# Patient Record
Sex: Female | Born: 1960 | Smoking: Current every day smoker
Health system: Southern US, Community
[De-identification: ages and names within clinical notes are randomized; demographics above are authoritative.]

## PROBLEM LIST (undated history)

## (undated) DIAGNOSIS — I471 Supraventricular tachycardia, unspecified: Secondary | ICD-10-CM

## (undated) DIAGNOSIS — M797 Fibromyalgia: Secondary | ICD-10-CM

## (undated) DIAGNOSIS — I219 Acute myocardial infarction, unspecified: Secondary | ICD-10-CM

## (undated) DIAGNOSIS — I2510 Atherosclerotic heart disease of native coronary artery without angina pectoris: Secondary | ICD-10-CM

## (undated) DIAGNOSIS — I429 Cardiomyopathy, unspecified: Secondary | ICD-10-CM

## (undated) DIAGNOSIS — F329 Major depressive disorder, single episode, unspecified: Secondary | ICD-10-CM

## (undated) DIAGNOSIS — F1410 Cocaine abuse, uncomplicated: Secondary | ICD-10-CM

## (undated) DIAGNOSIS — F32A Depression, unspecified: Secondary | ICD-10-CM

## (undated) DIAGNOSIS — N390 Urinary tract infection, site not specified: Secondary | ICD-10-CM

## (undated) HISTORY — DX: Cardiomyopathy, unspecified: I42.9

## (undated) HISTORY — DX: Fibromyalgia: M79.7

## (undated) HISTORY — PX: OTHER SURGICAL HISTORY: SHX169

## (undated) HISTORY — DX: Cocaine abuse, uncomplicated: F14.10

## (undated) HISTORY — DX: Major depressive disorder, single episode, unspecified: F32.9

## (undated) HISTORY — DX: Depression, unspecified: F32.A

## (undated) HISTORY — DX: Supraventricular tachycardia, unspecified: I47.10

## (undated) HISTORY — DX: Hypomagnesemia: E83.42

## (undated) HISTORY — DX: Urinary tract infection, site not specified: N39.0

## (undated) HISTORY — DX: Supraventricular tachycardia: I47.1

## (undated) HISTORY — DX: Atherosclerotic heart disease of native coronary artery without angina pectoris: I25.10

## (undated) HISTORY — DX: Acute myocardial infarction, unspecified: I21.9

---

## 2007-10-30 ENCOUNTER — Other Ambulatory Visit: Payer: Self-pay

## 2007-10-30 HISTORY — PX: CARDIAC CATHETERIZATION: SHX172

## 2007-10-30 IMAGING — CR DG CHEST 1V PORT
1 series · 2 of 2 positions shown · non-contrast
Comparison: none

REASON FOR EXAM: mi
COMMENTS:

PROCEDURE:     DXR - DXR PORTABLE CHEST SINGLE VIEW  - October 30, 2007 [DATE]
RESULT:     Comparison: No available comparison exam.

[Series 1: view not recorded · 0.17mm/px · 2 of 2 slices shown]
[im 1/2]
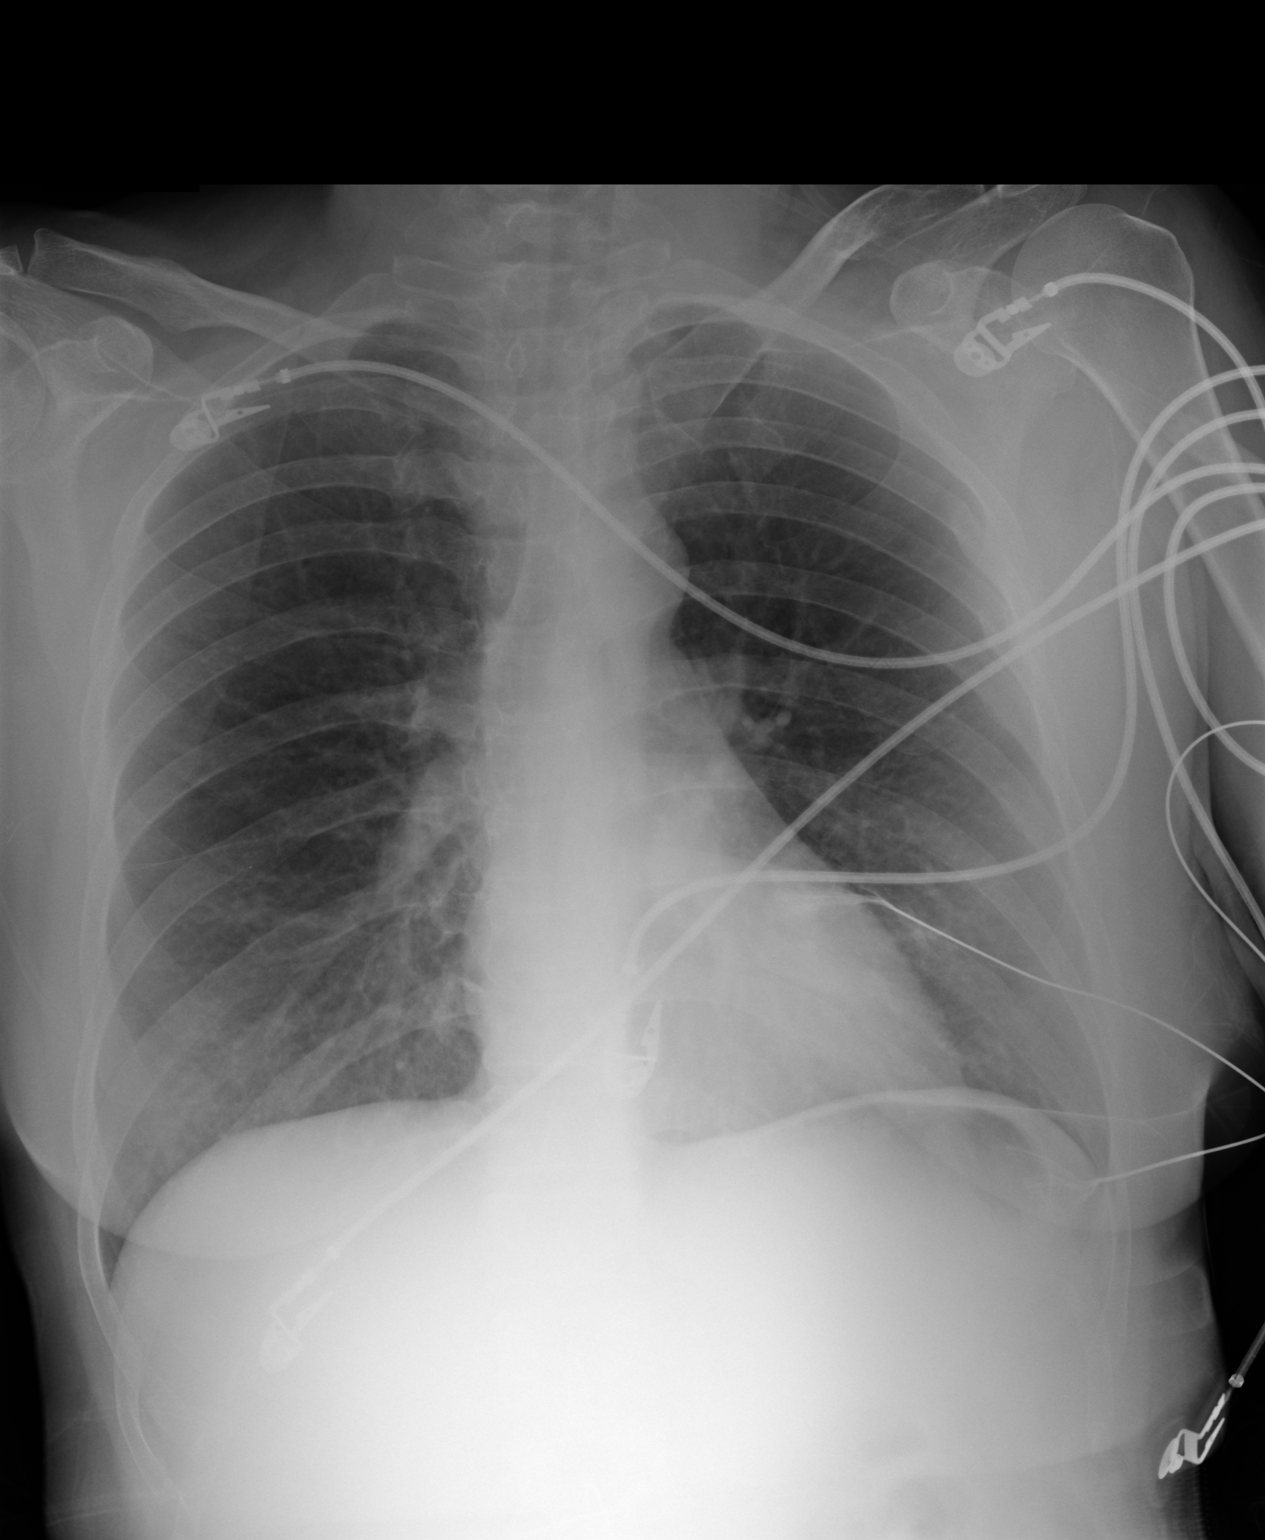
[im 2/2]
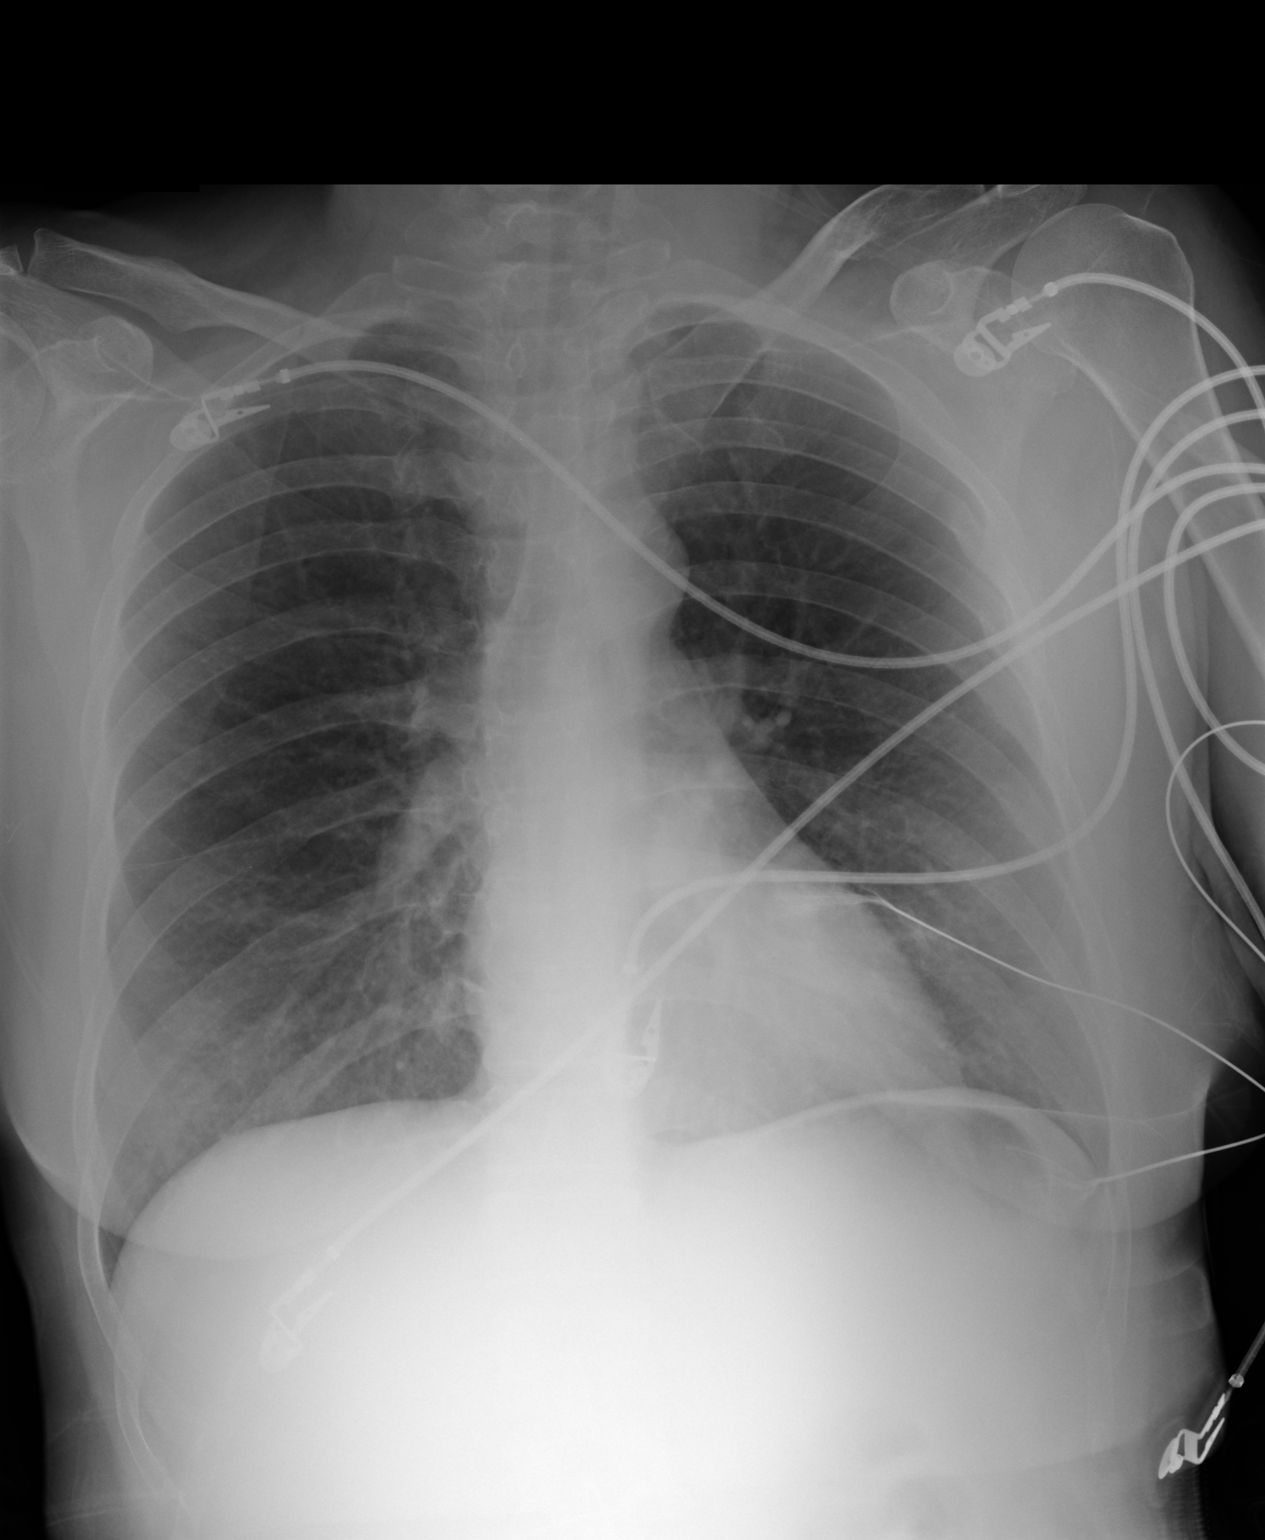

[2 of 2 positions shown; findings below may reference images not displayed]

FINDINGS: There is no significant pulmonary consolidation, pulmonary edema, pleural
effusion, nor pneumothorax. The cardiomediastinal silhouette is within
normal limits.

Old right posterolateral left fourth and fifth rib fracture deformities are
noted.
IMPRESSION: 1. No acute cardiopulmonary abnormality is noted.

## 2007-11-05 ENCOUNTER — Other Ambulatory Visit: Payer: Self-pay

## 2007-11-05 HISTORY — PX: CARDIAC CATHETERIZATION: SHX172

## 2007-11-05 IMAGING — CR DG CHEST 1V PORT
1 series · 1 of 1 positions shown · non-contrast
Comparison: none

REASON FOR EXAM: Chest pain
COMMENTS:

[view not recorded]
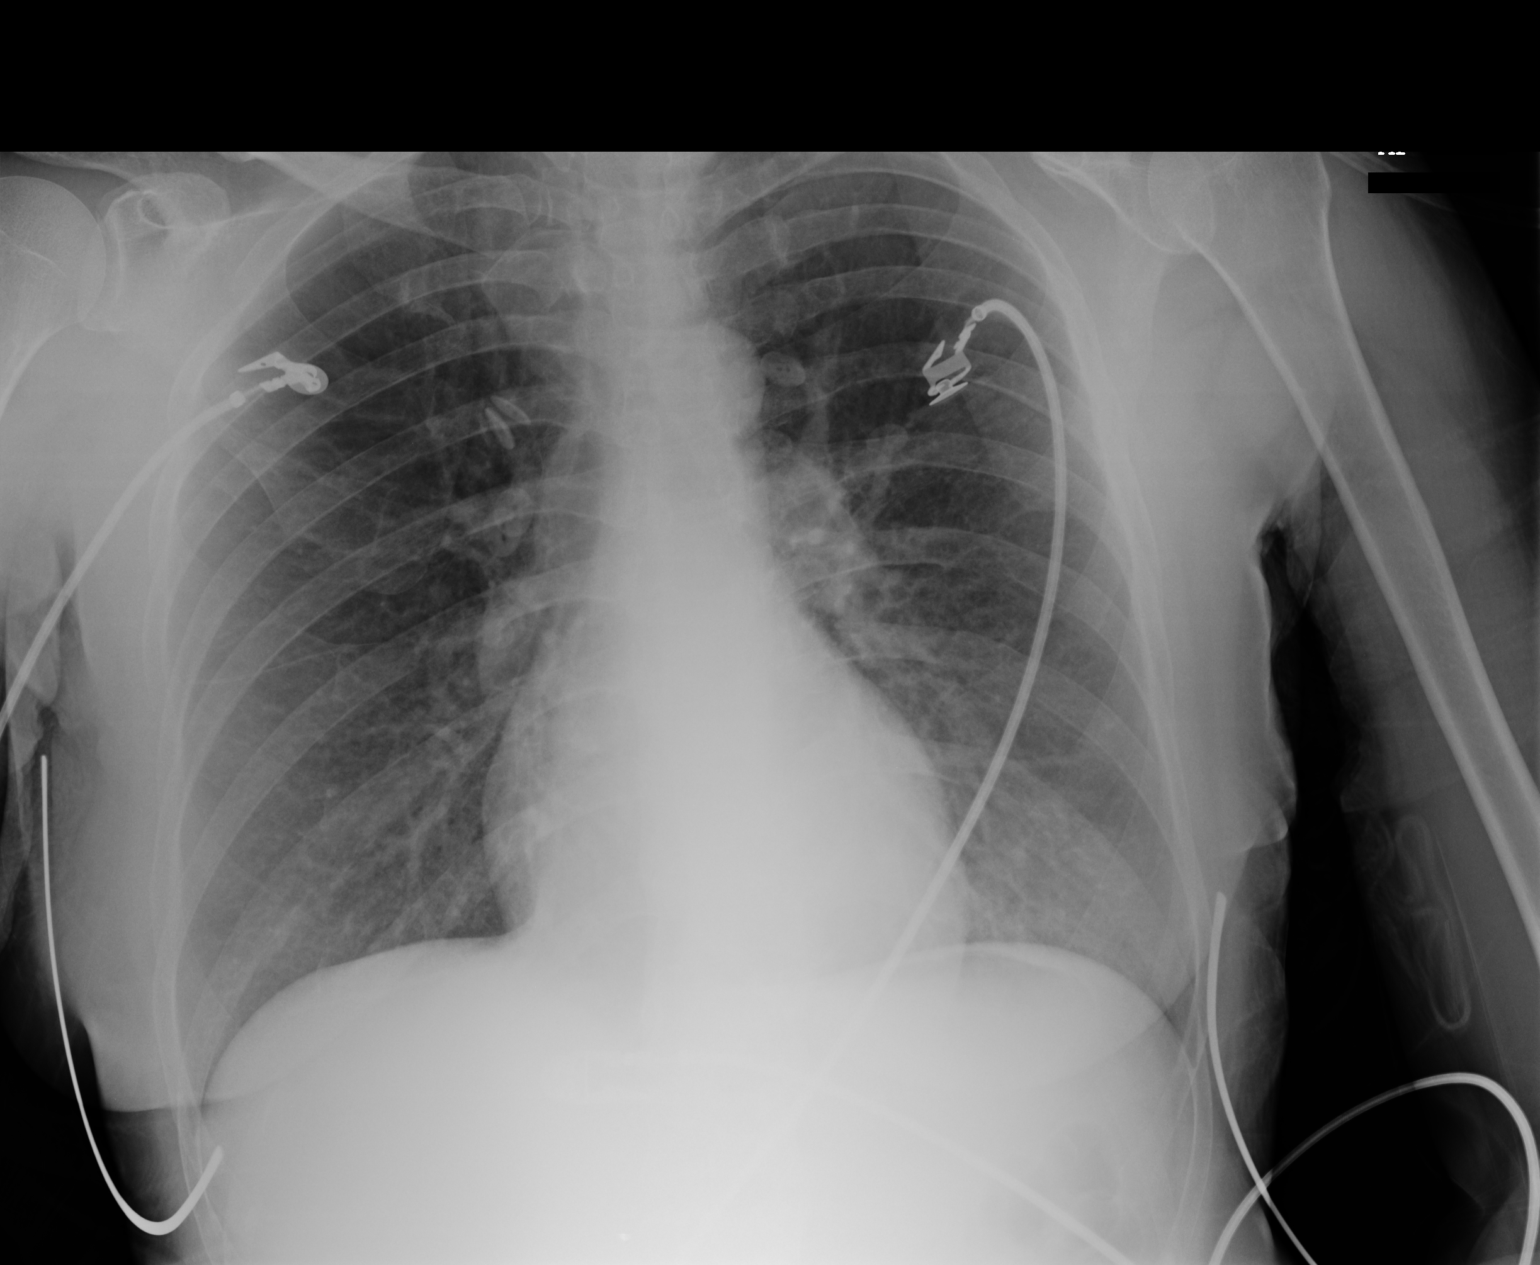

[1 of 1 positions shown; findings below may reference images not displayed]

PROCEDURE:     DXR - DXR PORTABLE CHEST SINGLE VIEW  - November 05, 2007 [DATE]

RESULT:     Comparison is made to a prior exam of 10/30/2007.

There is mild prominence of the pulmonary vascularity compatible with
pulmonary vascular congestion. No pulmonary edema or pleural effusion is
seen. The heart size is normal. Monitoring electrodes are present.
IMPRESSION: 1.  No pneumonia is identified.
2.  There is mild prominence of the pulmonary vascularity compatible with
pulmonary vascular congestion but with no pulmonary edema or pleural
effusion identified at this time.

## 2008-06-06 ENCOUNTER — Other Ambulatory Visit: Payer: Self-pay

## 2008-06-06 ENCOUNTER — Other Ambulatory Visit: Payer: Self-pay | Admitting: Internal Medicine

## 2008-06-06 IMAGING — CR DG CHEST 1V PORT
1 series · 1 of 1 positions shown · non-contrast
Comparison: none

REASON FOR EXAM: chest pain
COMMENTS:

[view not recorded]
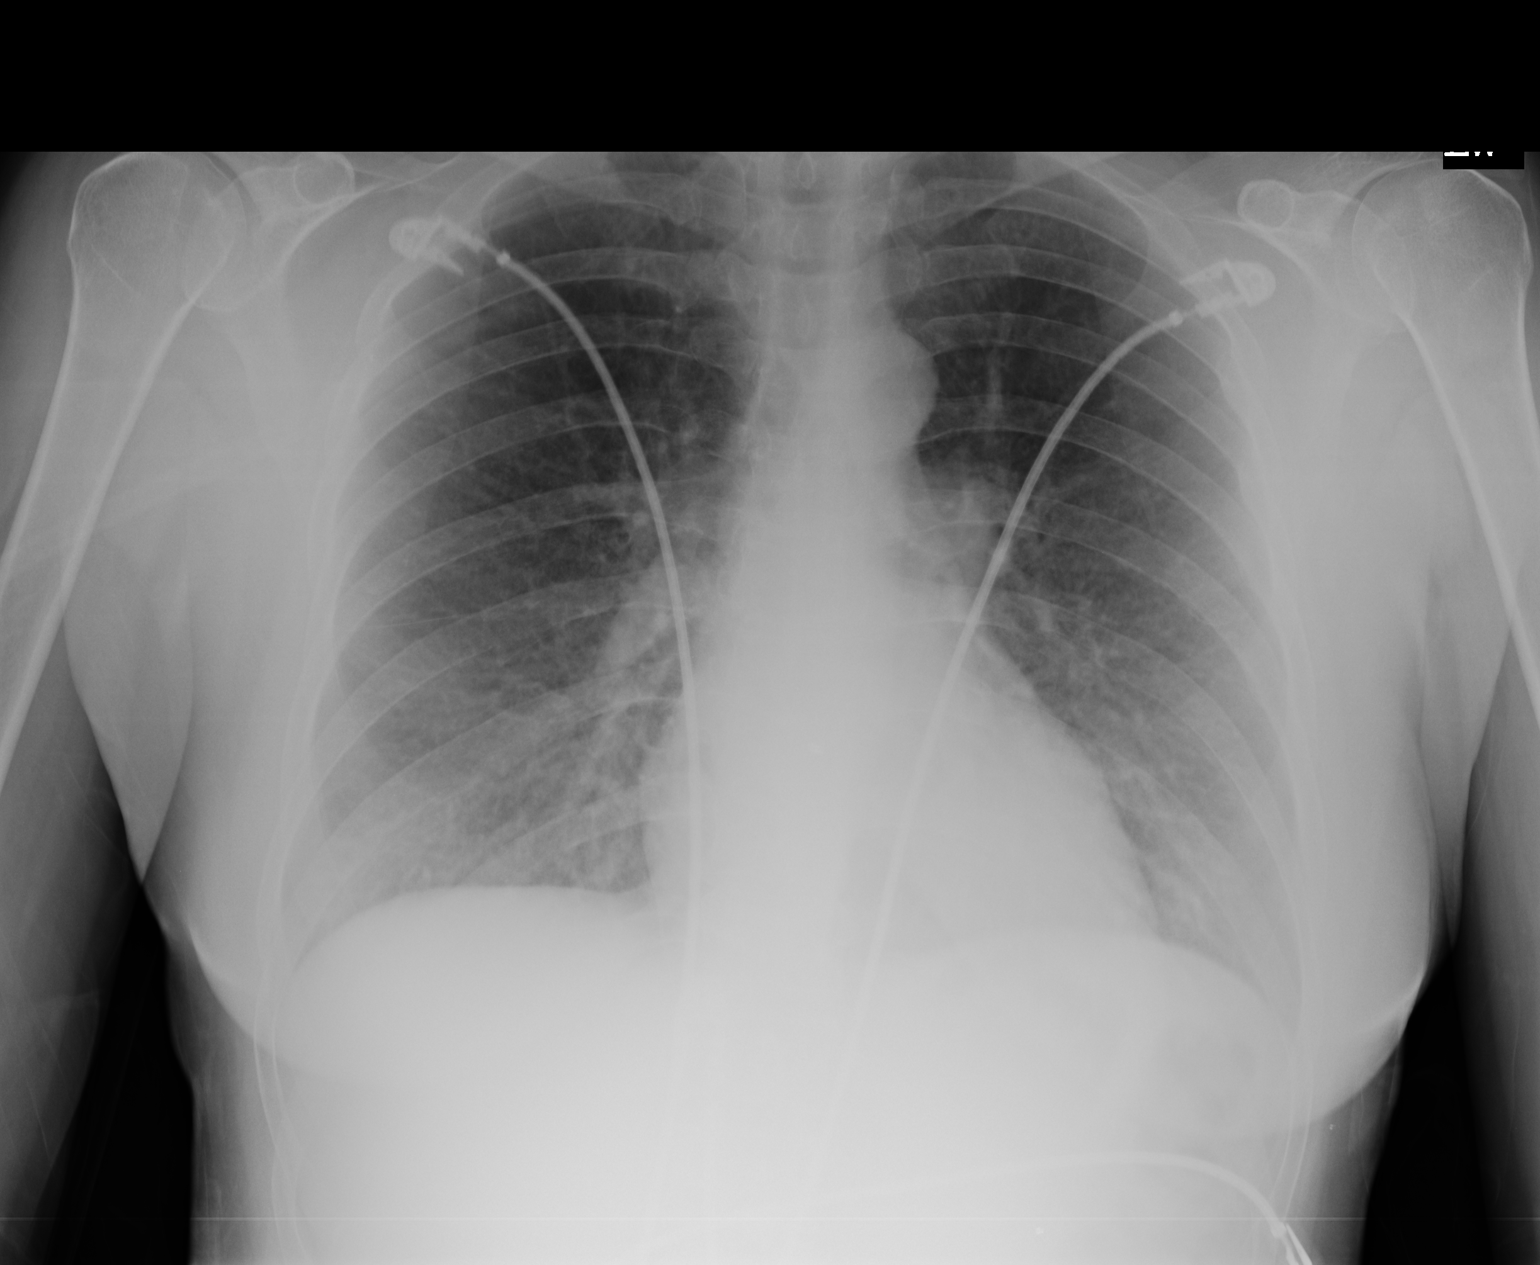

[1 of 1 positions shown; findings below may reference images not displayed]

PROCEDURE:     DXR - DXR PORTABLE CHEST SINGLE VIEW  - June 06, 2008  [DATE]

RESULT:     Comparison is made to a study 05 November, 2007.

The lungs are well expanded. There is no focal infiltrate. The heart is
normal in size. The pulmonary vascularity is mildly prominent centrally.
This is stable. There is no pleural effusion.
IMPRESSION: I do not see objective evidence of CHF nor pneumonia. The lungs are
well-expanded which may be voluntary or which may reflect an element of
underlying COPD or reactive airway disease. Overall the chest has not
changed significantly in appearance since 05 November, 2007.

## 2008-11-11 HISTORY — PX: OTHER SURGICAL HISTORY: SHX169

## 2008-11-28 ENCOUNTER — Other Ambulatory Visit: Payer: Self-pay

## 2008-11-28 IMAGING — CR DG CHEST 1V PORT
1 series · 1 of 1 positions shown · non-contrast
Comparison: none

REASON FOR EXAM: Chest Pain
COMMENTS:

[view not recorded]
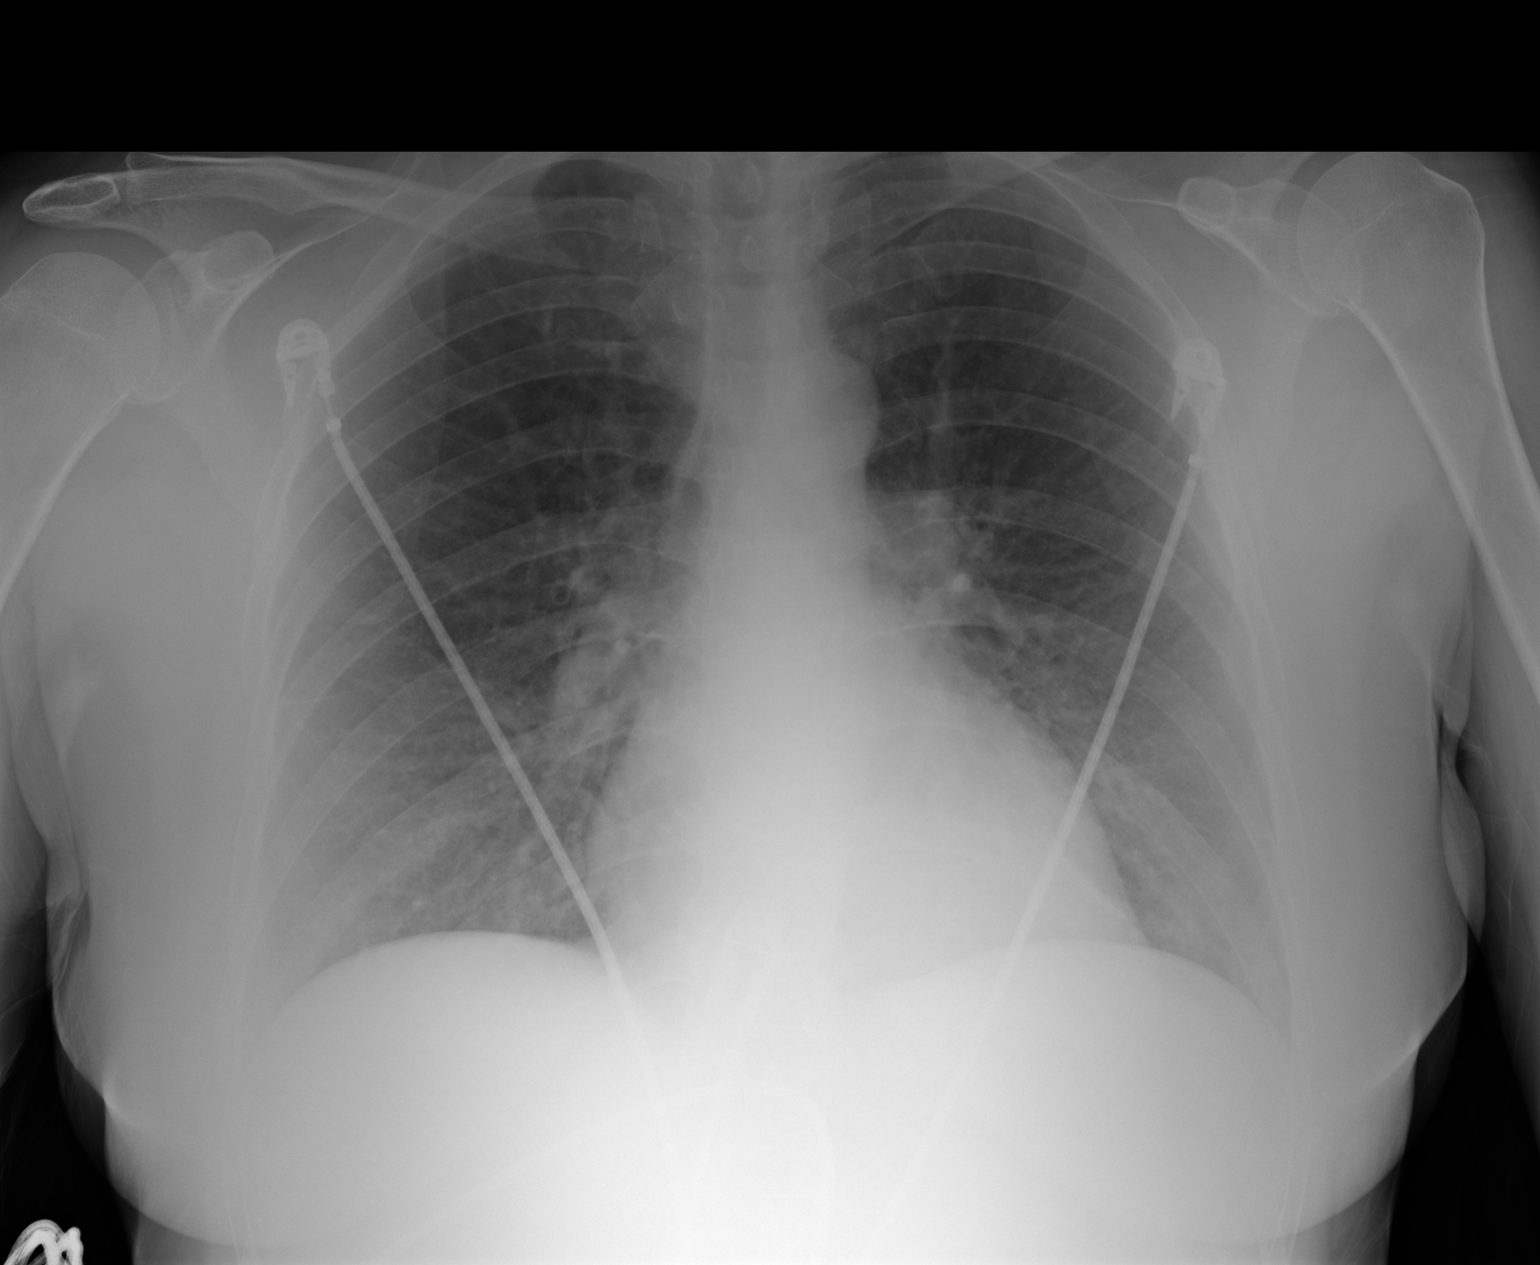

[1 of 1 positions shown; findings below may reference images not displayed]

PROCEDURE:     DXR - DXR PORTABLE CHEST SINGLE VIEW  - November 28, 2008  [DATE]

RESULT:     Comparison is made to the study of 06/06/2008.

Cardiac monitoring electrodes are present. The lungs are clear. The heart
and pulmonary vessels are normal. The bony and mediastinal structures are
unremarkable. There is no effusion. There is no pneumothorax or evidence of
congestive failure.
IMPRESSION: No acute cardiopulmonary disease.

## 2009-09-27 ENCOUNTER — Other Ambulatory Visit: Payer: Self-pay | Admitting: Emergency Medicine

## 2009-09-28 IMAGING — CR DG CHEST 1V PORT
1 series · 1 of 1 positions shown · non-contrast
Comparison: none

REASON FOR EXAM: chest pain
COMMENTS:

PROCEDURE:     DXR - DXR PORTABLE CHEST SINGLE VIEW  - September 28, 2009 [DATE]
RESULT:     Comparison is made to the prior exam of 11/28/2008. The lung
fields are clear. No pneumonia, pneumothorax or pleural effusion is seen.
Heart size is normal. Monitoring electrodes are present.

[view not recorded]
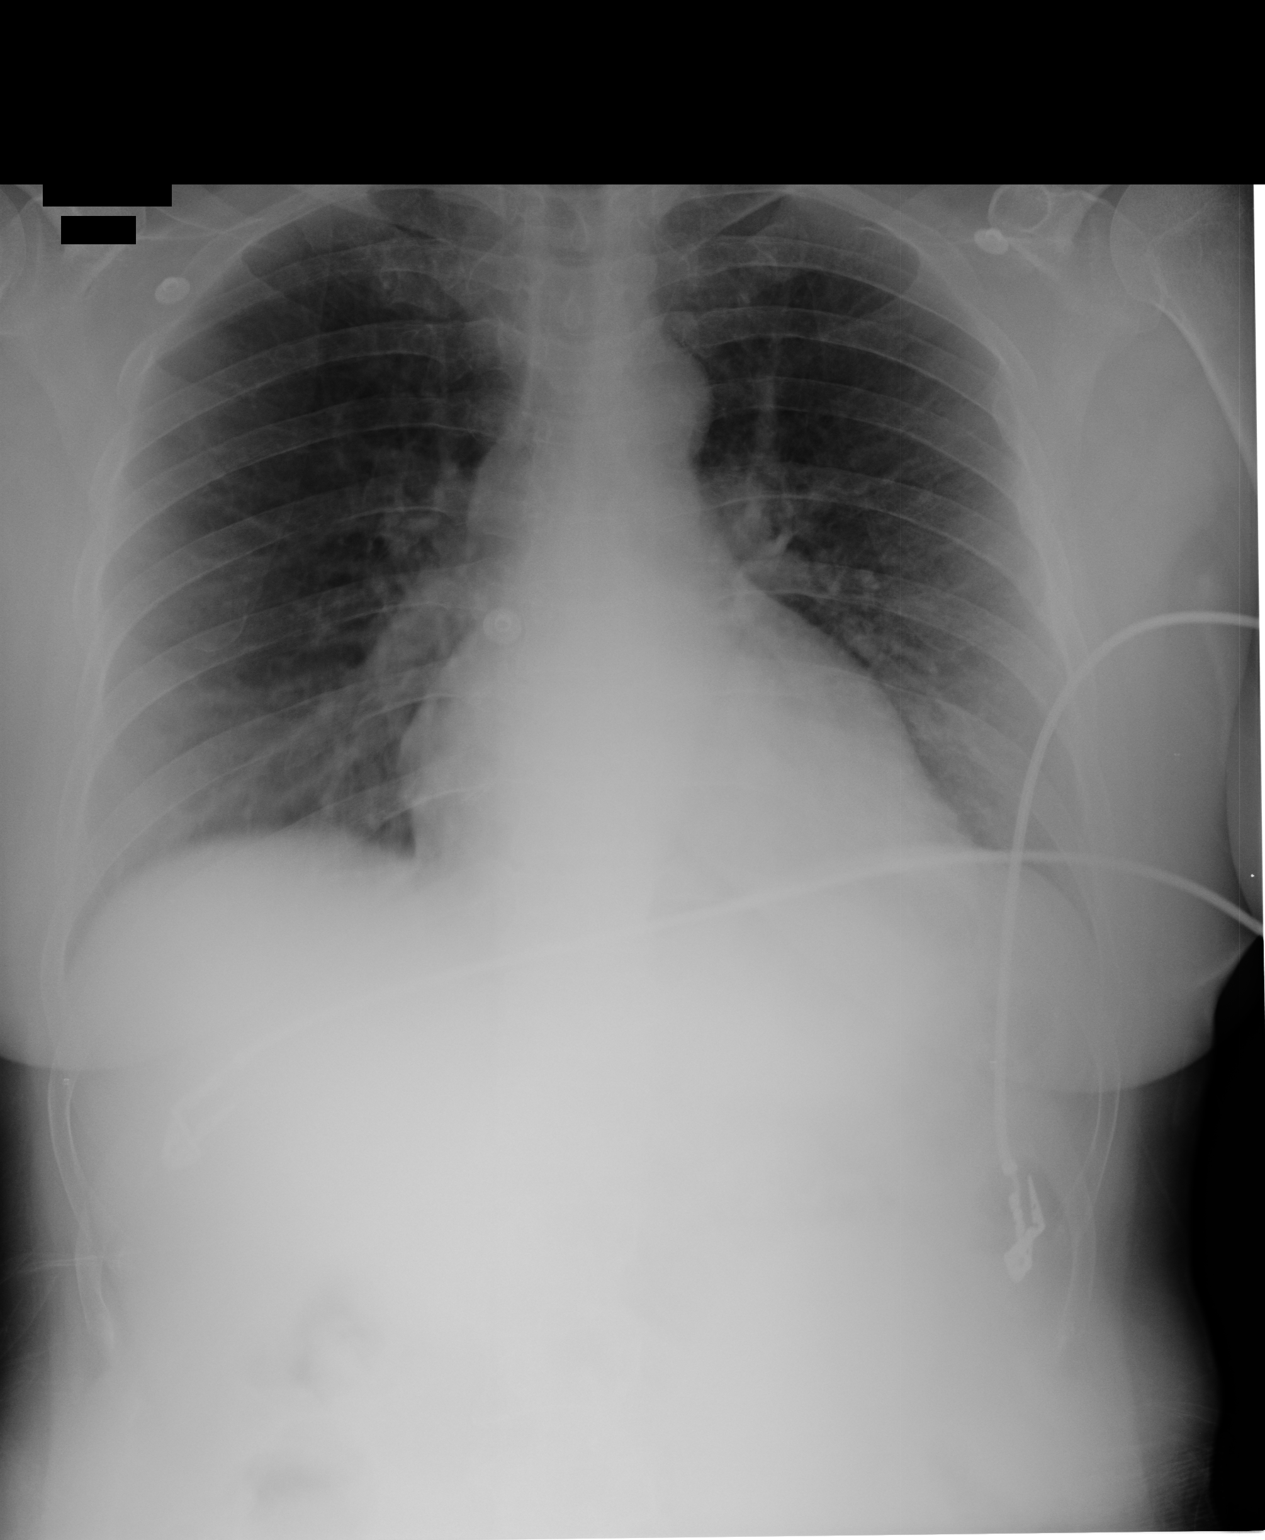

[1 of 1 positions shown; findings below may reference images not displayed]

IMPRESSION: No acute changes are identified.

## 2009-09-28 IMAGING — CT CT HEAD WITHOUT CONTRAST
2 series · 15 of 30 positions shown, 19 images · non-contrast
Comparison: none

REASON FOR EXAM: headache fever
COMMENTS:

[Series 2: without · axial · non-contrast · 0.40mm/px · z∈[+226,+350]mm · 13 of 31 slices shown, 17 images]
[im 3/31  brain]
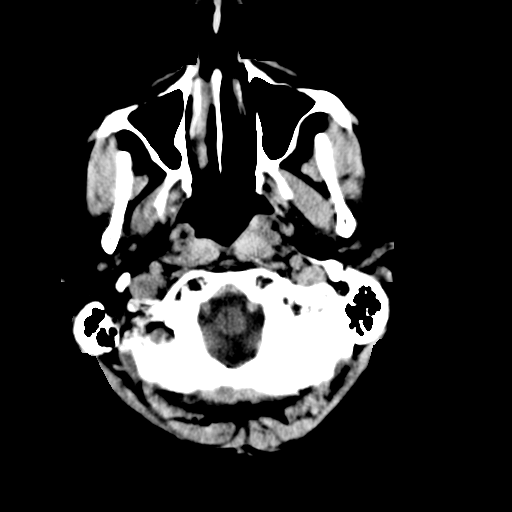
[im 3/31  bone]
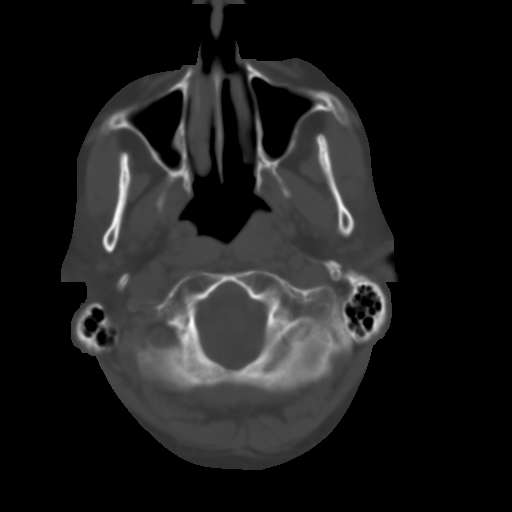
[im 5/31  brain]
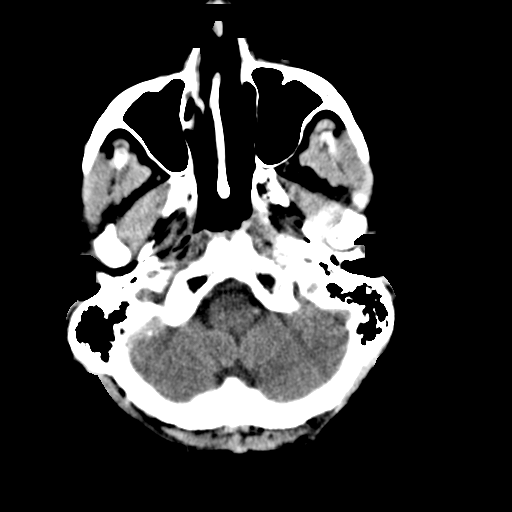
[im 7/31  brain]
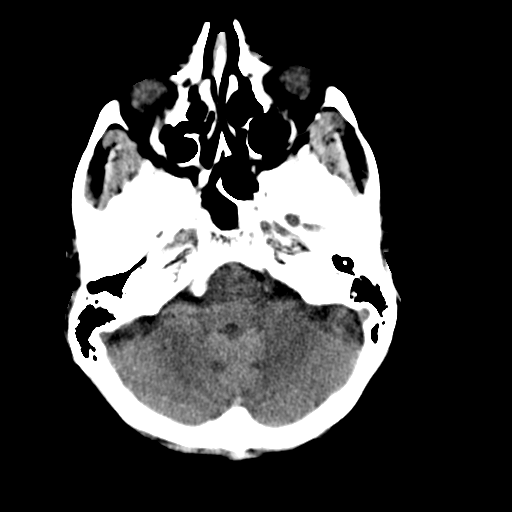
[im 9/31  brain]
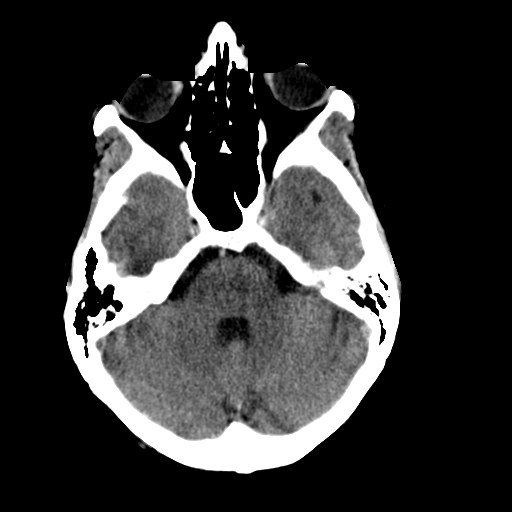
[im 11/31  brain]
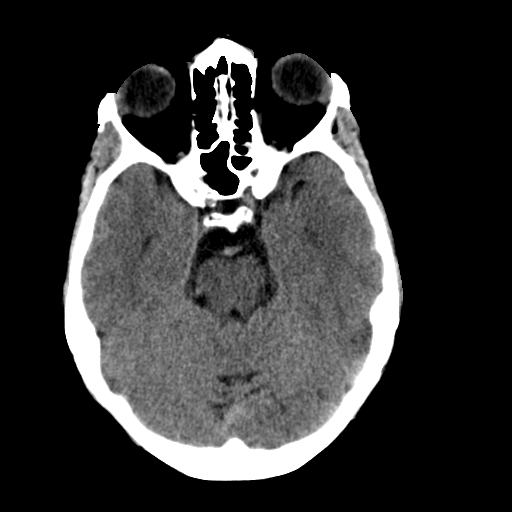
[im 11/31  bone]
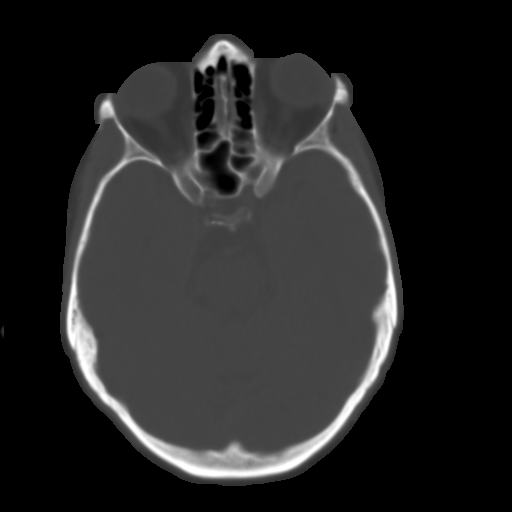
[im 13/31  brain]
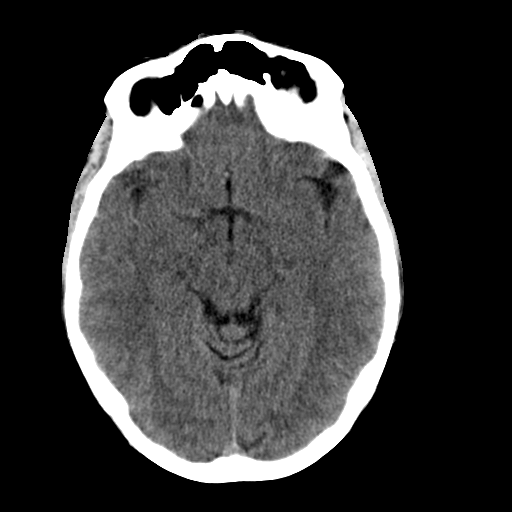
[im 16/31  brain]
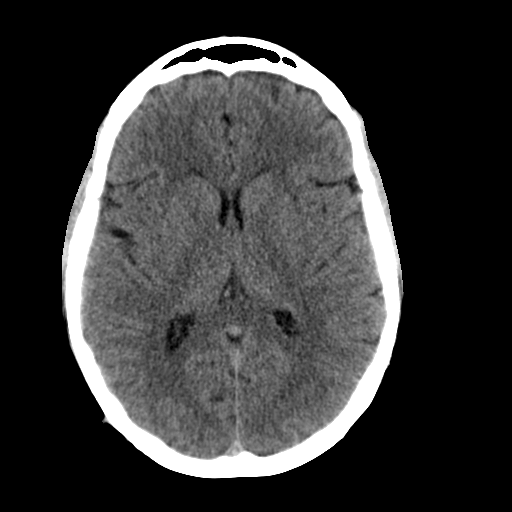
[im 18/31  brain]
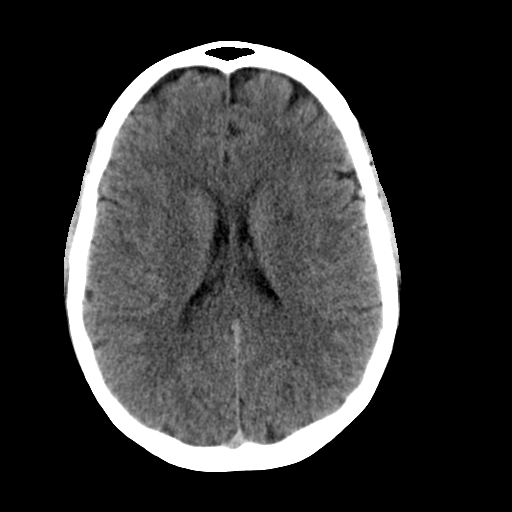
[im 20/31  brain]
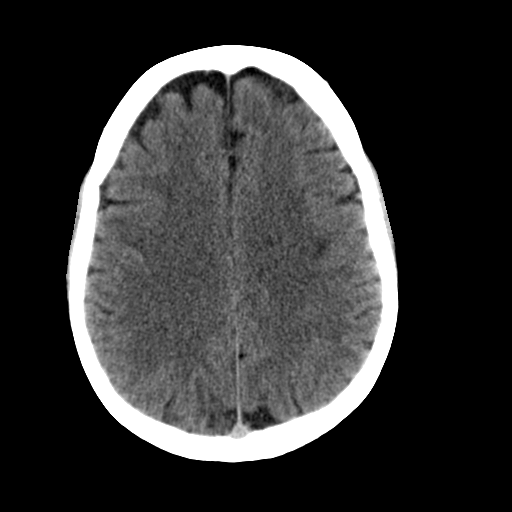
[im 20/31  bone]
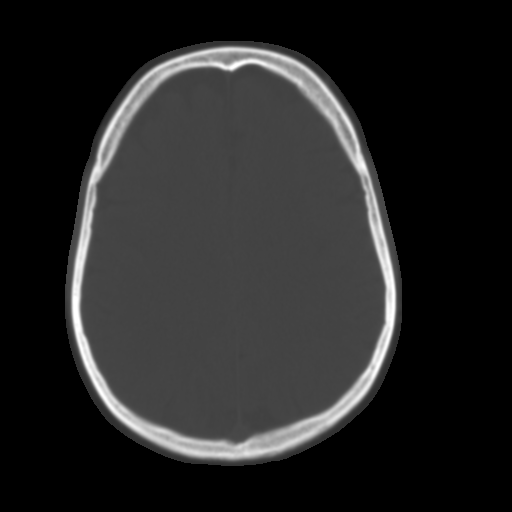
[im 22/31  brain]
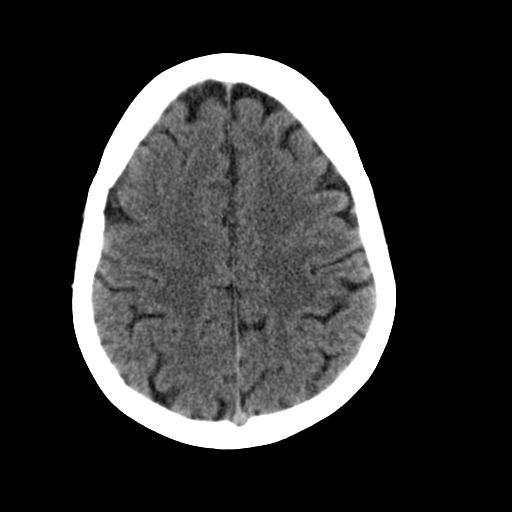
[im 24/31  brain]
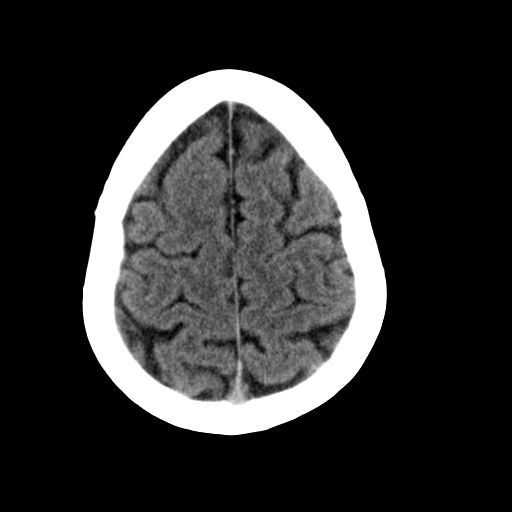
[im 26/31  brain]
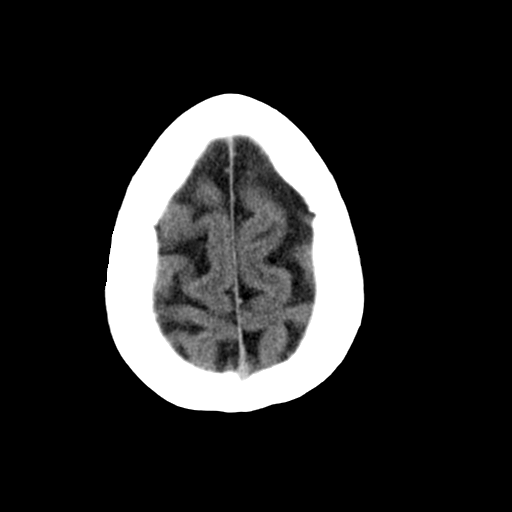
[im 28/31  brain]
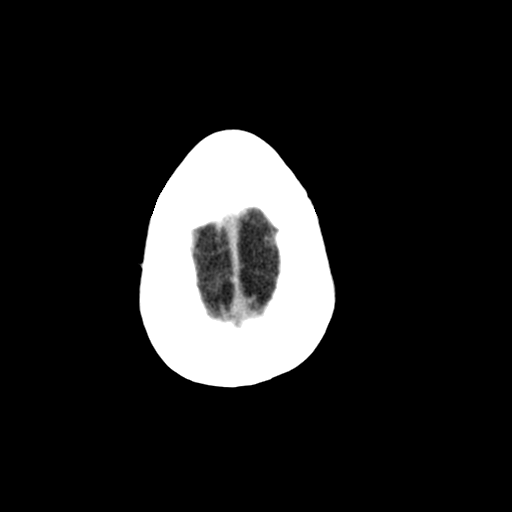
[im 28/31  bone]
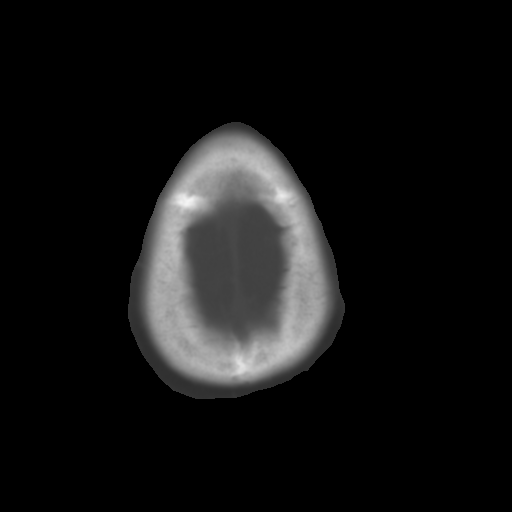

[Series 3: bone · axial · 0.40mm/px · z∈[+226,+246]mm · 2 of 31 slices shown]
[im 3/31  bone]
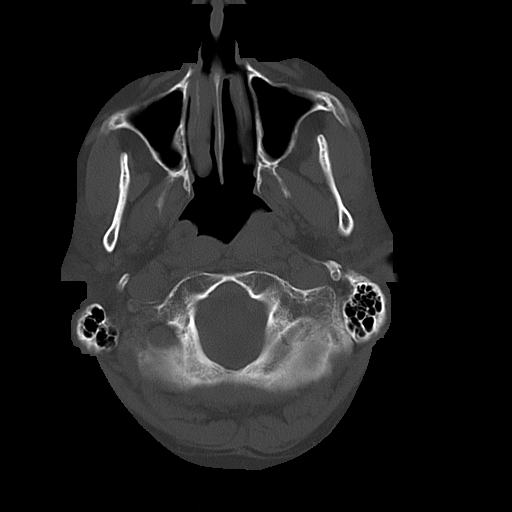
[im 7/31  bone]
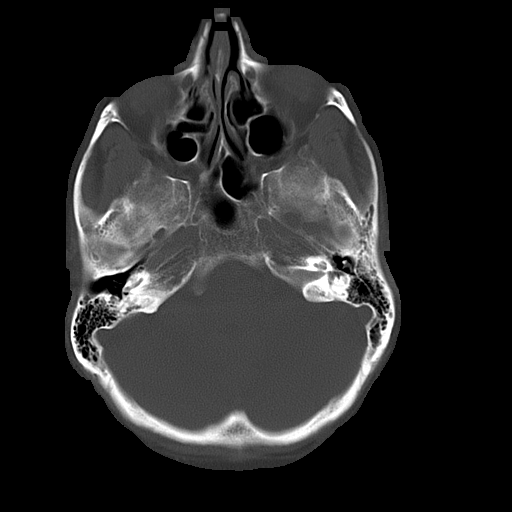

[15 of 30 positions shown; findings below may reference images not displayed]

PROCEDURE:     CT  - CT HEAD WITHOUT CONTRAST  - [DATE]  [DATE]

RESULT:     Axial noncontrast CT scanning was performed through the brain at
5 mm intervals and slice thicknesses.

The ventricles are normal in size and position. There is no intracranial
hemorrhage nor intracranial mass effect. There is decreased density in the
deep white matter of the anterior parietal lobe on the left and to a lesser
extent on the right which likely reflects the sequelae of chronic small
vessel ischemic type change. At bone window settings there is no evidence of
acute paranasal sinus inflammation nor acute skull fracture.
IMPRESSION: 1. I do not see evidence of an acute intracranial hemorrhage.
2. Deep white matter hypodensity is present which likely reflects the
sequelae of chronic small vessel ischemic type change but other etiologies
are not excluded.
3. I do not see evidence of acute paranasal sinus inflammation.

It may be useful to consider the patient for followup MRI of the brain if
the symptoms persist.

A preliminary report was sent to the [HOSPITAL] the conclusion
of the study.

## 2012-11-15 ENCOUNTER — Other Ambulatory Visit: Payer: Self-pay

## 2012-11-15 ENCOUNTER — Other Ambulatory Visit: Payer: Self-pay | Admitting: Emergency Medicine

## 2012-11-15 IMAGING — CT CT CHEST W/ CM
1 series · 16 of 33 positions shown, 20 images · IV contrast (APPLIED)
Comparison: none

REASON FOR EXAM: tachycardia w/ cp and elev d-dimer
COMMENTS:

PROCEDURE:     CT  - CT CHEST (FOR PE) W  - November 15, 2012  [DATE]
RESULT:     History: Tachycardia.
Comparison Study: Chest x-ray of 11/15/2012.

[Series 4: soft tissue · axial · 0.67mm/px · z∈[-444,-132]mm · 16 of 114 slices shown, 20 images]
[im 5/114  mediastinal]
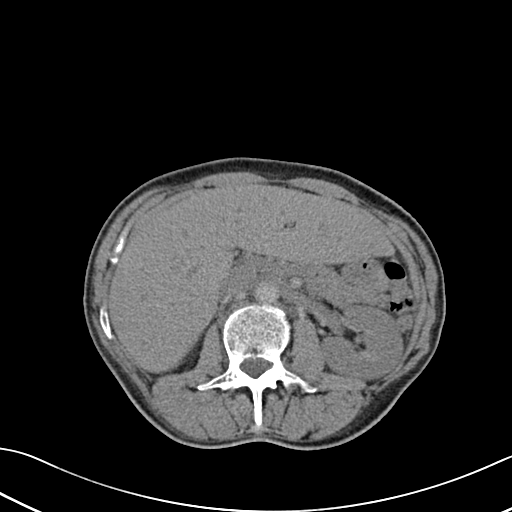
[im 5/114  lung]
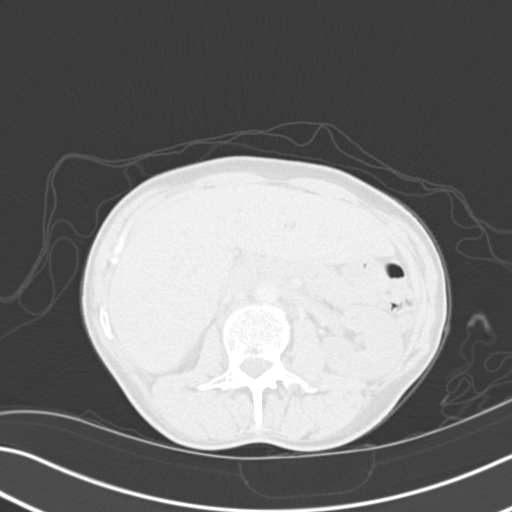
[im 13/114  lung]
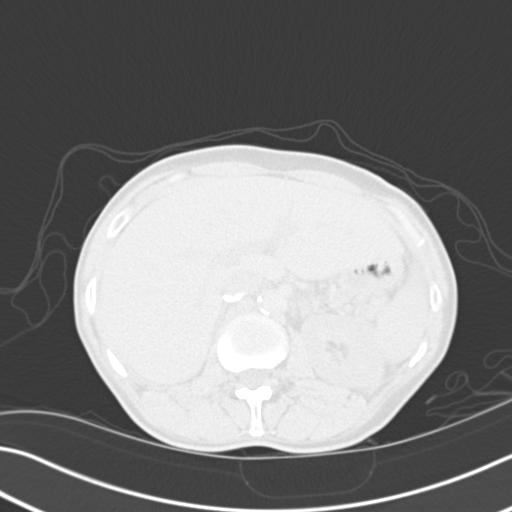
[im 21/114  lung]
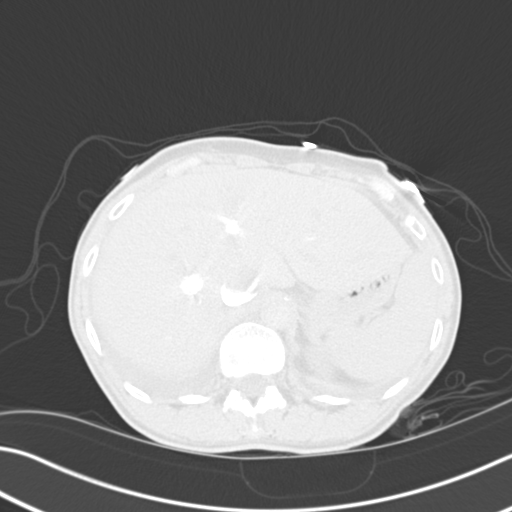
[im 26/114  lung]
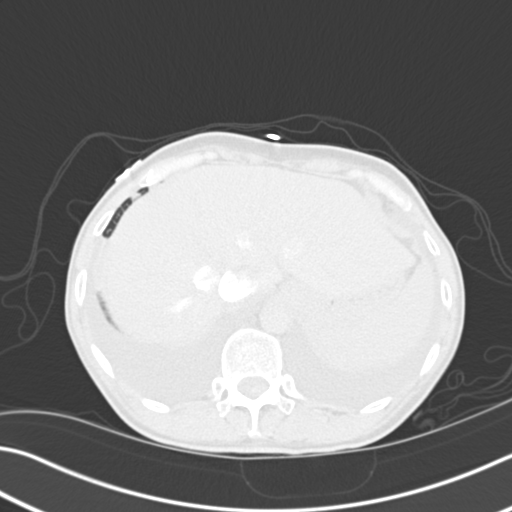
[im 34/114  mediastinal]
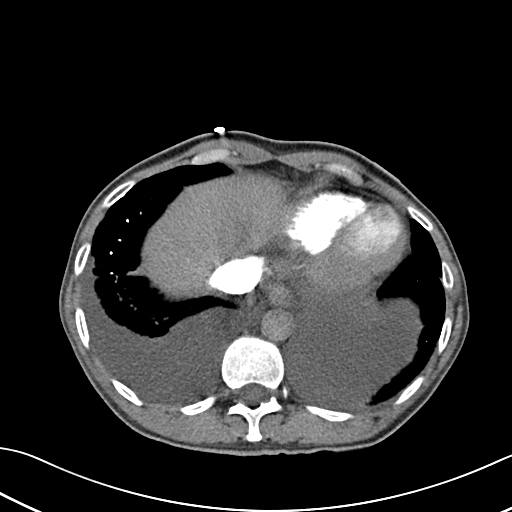
[im 34/114  lung]
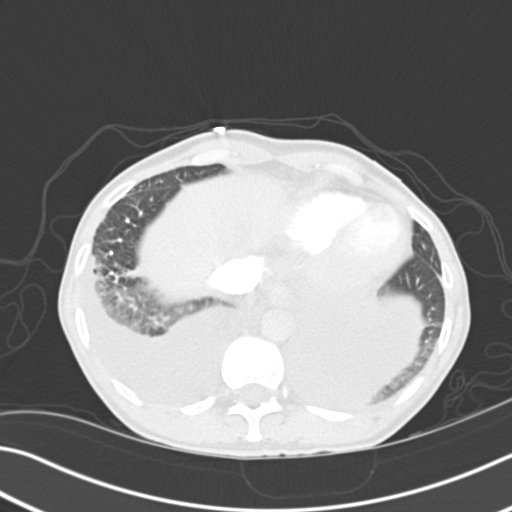
[im 42/114  lung]
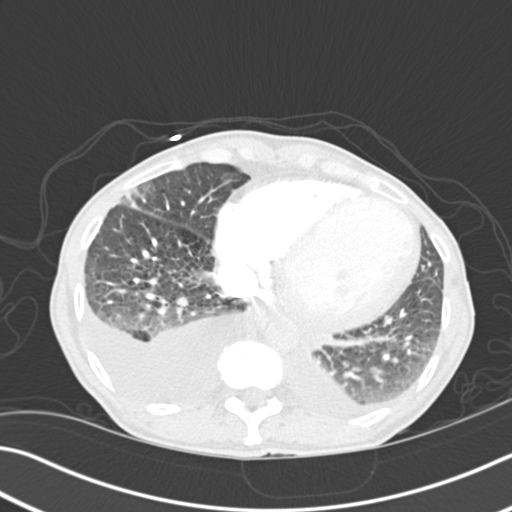
[im 47/114  lung]
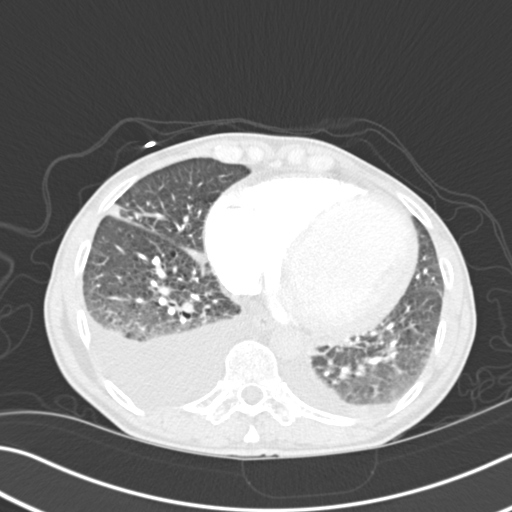
[im 55/114  lung]
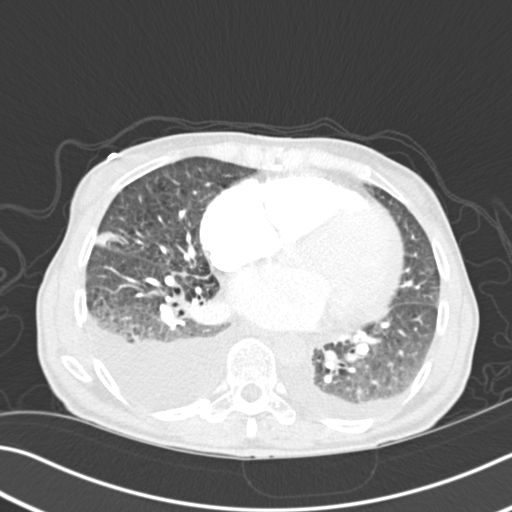
[im 60/114  mediastinal]
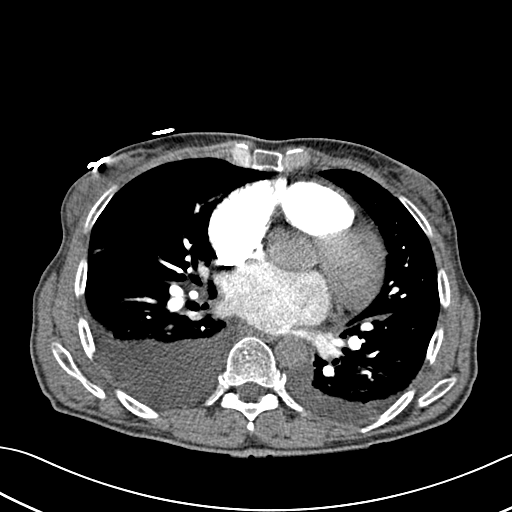
[im 60/114  lung]
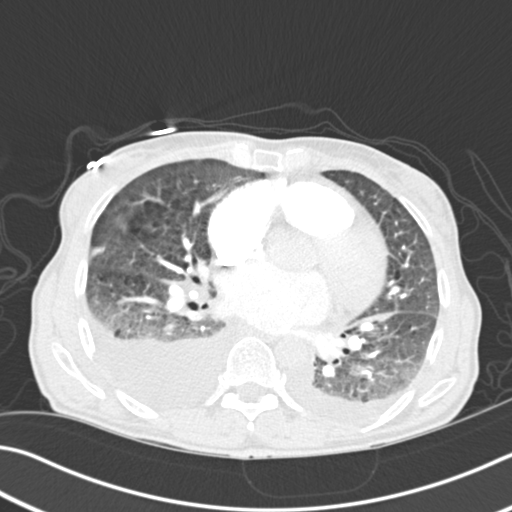
[im 67/114  lung]
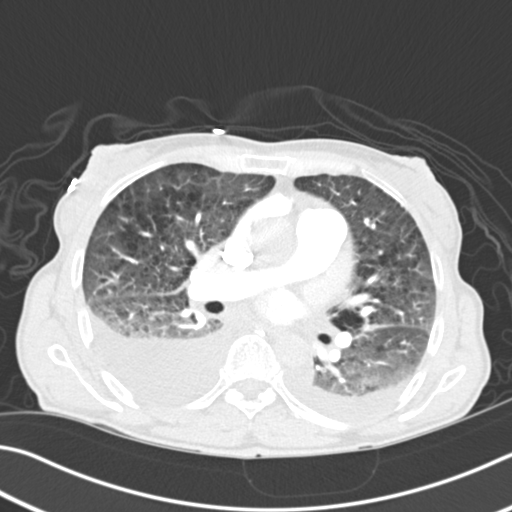
[im 72/114  lung]
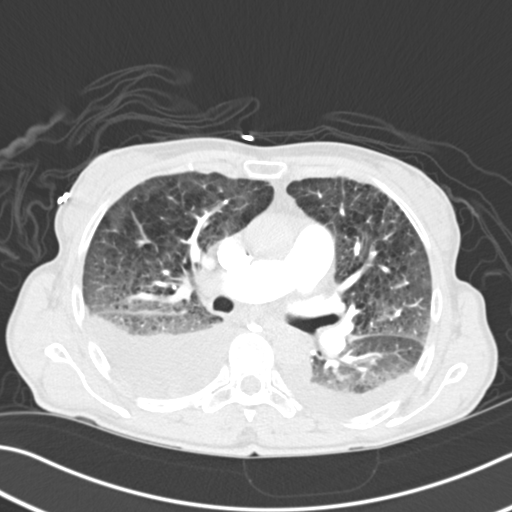
[im 80/114  lung]
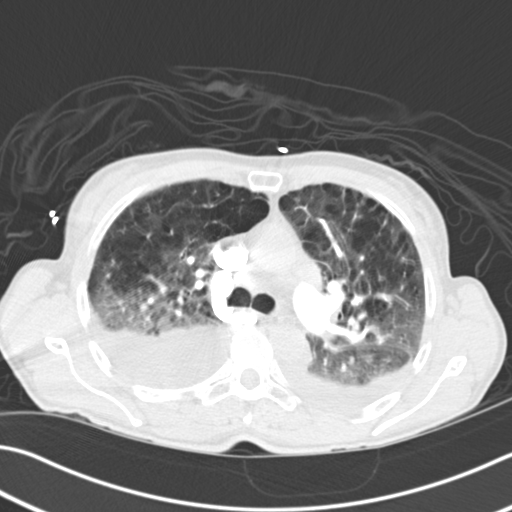
[im 88/114  mediastinal]
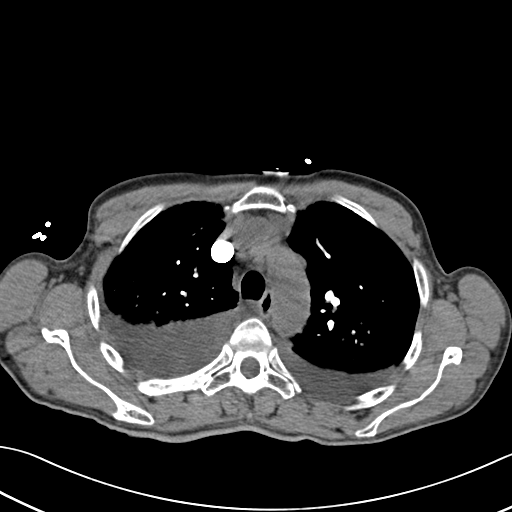
[im 88/114  lung]
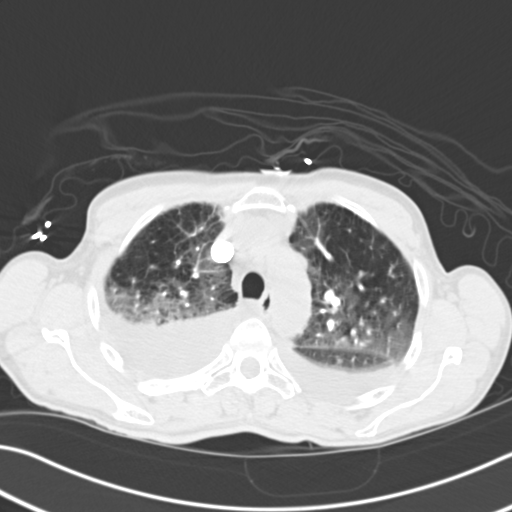
[im 93/114  lung]
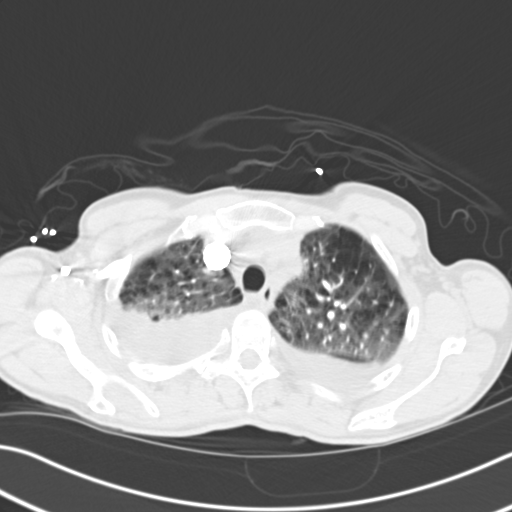
[im 101/114  lung]
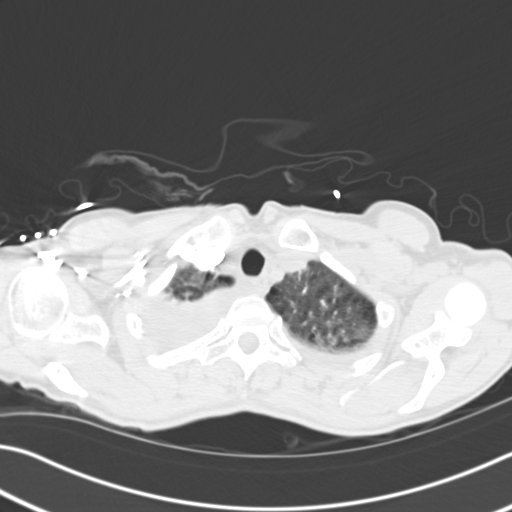
[im 109/114  lung]
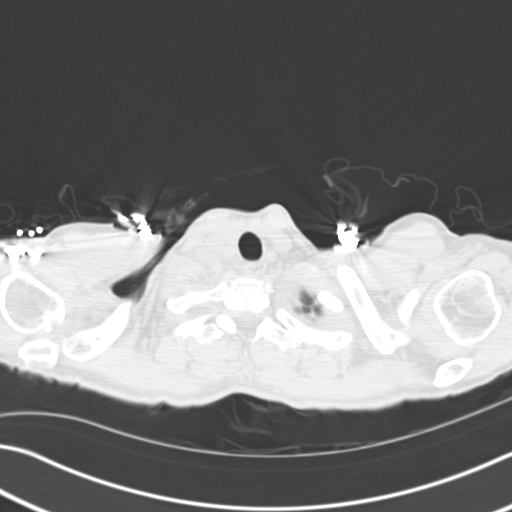

[16 of 33 positions shown; findings below may reference images not displayed]

FINDINGS: Standard CT performed with 100 cc of [id number]. Thoracic aorta
normal caliber. Coronary artery disease. Cardiomegaly with diffuse bilateral
pulmonary edema and bilateral pleural effusions consistent with congestive
heart failure. No pulmonary embolus. Adrenals unremarkable.
IMPRESSION: CHF with prominent bilateral pulmonary edema and bilateral
pleural effusions.

## 2012-11-15 IMAGING — CR DG CHEST 1V PORT
1 series · 1 of 1 positions shown · non-contrast
Comparison: none

REASON FOR EXAM: Chest Pain
COMMENTS:

PROCEDURE:     DXR - DXR PORTABLE CHEST SINGLE VIEW  - November 15, 2012  [DATE]
RESULT:     Comparison: 09/28/2009

[ap]
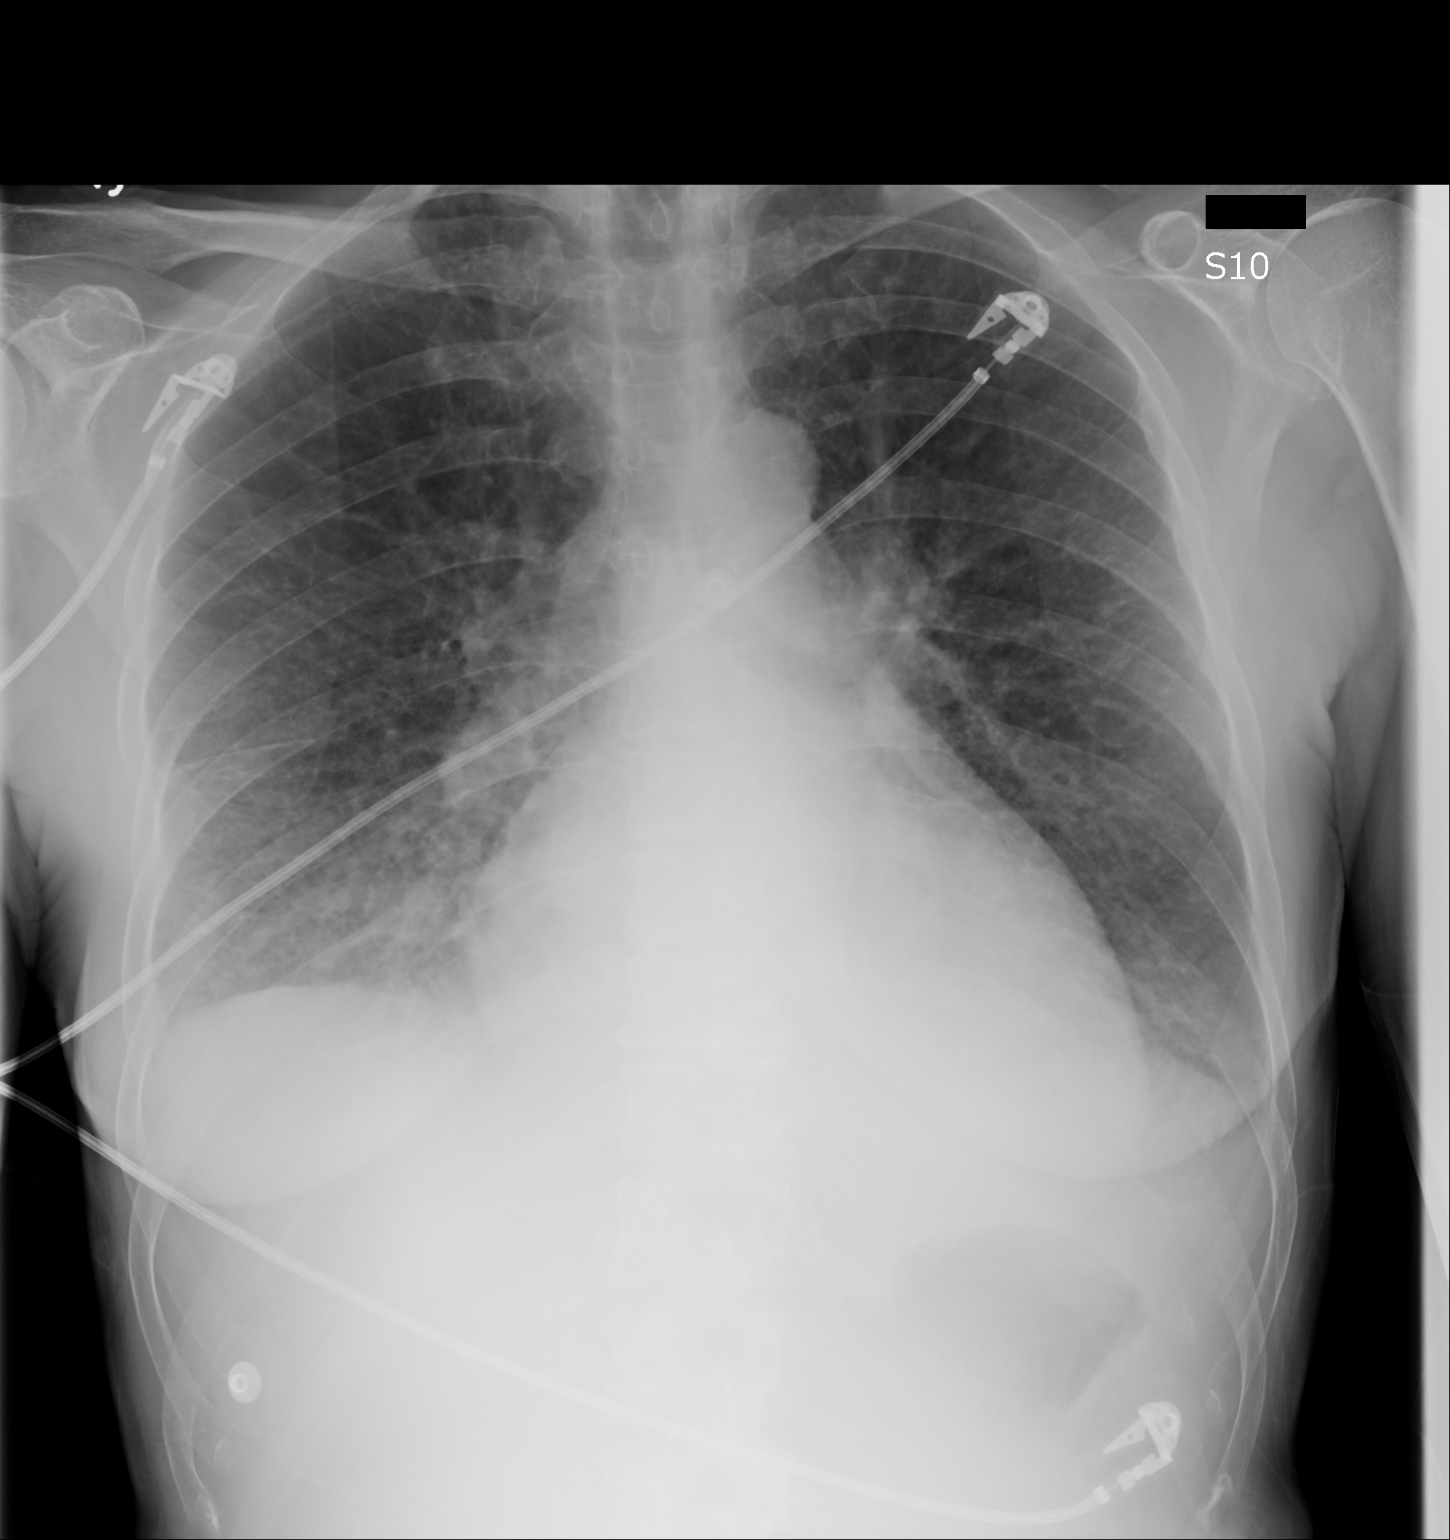

[1 of 1 positions shown; findings below may reference images not displayed]

FINDINGS: Heart size is upper limits normal to mildly enlarged. This is similar to
slightly increased from prior. Mild hilar prominence is likely related to
prominent central to pulmonary vasculature. There are bilateral interstitial
opacities.
IMPRESSION: 1. Nonspecific interstitial opacities could represent interstitial edema,
atypical infection, or interstitial lung disease.
2. Mild hilar prominence is likely related to prominent central pulmonary
vasculature. Lymphadenopathy would be difficult to exclude. Followup PA and
lateral chest radiograph is recommended.

[REDACTED]

## 2012-11-16 DIAGNOSIS — R0602 Shortness of breath: Secondary | ICD-10-CM

## 2012-11-16 DIAGNOSIS — R079 Chest pain, unspecified: Secondary | ICD-10-CM

## 2012-11-17 ENCOUNTER — Telehealth: Payer: Self-pay

## 2012-11-17 DIAGNOSIS — I471 Supraventricular tachycardia, unspecified: Secondary | ICD-10-CM

## 2012-11-17 NOTE — Telephone Encounter (Signed)
Pt d/c today 5/7 Will attempt TCM #1 5/8 

## 2012-11-17 NOTE — Telephone Encounter (Signed)
Message copied by Marcelle Overlie on Wed Nov 17, 2012  3:16 PM ------      Message from: Coralee Rud      Created: Wed Nov 17, 2012  2:59 PM      Regarding: tcm/ph       Tcm/ph 11/25/12 ------

## 2012-11-17 NOTE — Telephone Encounter (Signed)
tcm

## 2012-11-18 ENCOUNTER — Encounter: Payer: Self-pay | Admitting: Cardiovascular Disease

## 2012-11-18 NOTE — Telephone Encounter (Signed)
TCM call #1- I left a message at the patient's only listed contact # to call.

## 2012-11-19 NOTE — Telephone Encounter (Signed)
Patient contacted regarding discharge from [facility] on 11/17/12.  Patient understands to follow up with provider Alinda Money, PA on 11/25/12 at 2:30 pm at [facility] office. Patient understands discharge instructions? yes Patient understands medications and regiment? yes Patient understands to bring all medications to this visit? yes  Pt says she is on her way to work this am. She works at [employer]. I explained, b/c of her recent d/c we could give her a note. She declines, says she will "just answer phones today". She says she still has occasional CP but attributes this to stress. No further tachycardia spells. Confirms compliance with meds as prescribed.

## 2012-11-24 ENCOUNTER — Encounter: Payer: Self-pay | Admitting: *Deleted

## 2012-11-25 ENCOUNTER — Encounter: Payer: Self-pay | Admitting: Physician Assistant

## 2012-11-25 ENCOUNTER — Ambulatory Visit (INDEPENDENT_AMBULATORY_CARE_PROVIDER_SITE_OTHER): Payer: 59 | Admitting: Physician Assistant

## 2012-11-25 VITALS — BP 102/62 | HR 81 | Ht 65.0 in | Wt 109.8 lb

## 2012-11-25 DIAGNOSIS — I2510 Atherosclerotic heart disease of native coronary artery without angina pectoris: Secondary | ICD-10-CM

## 2012-11-25 DIAGNOSIS — I429 Cardiomyopathy, unspecified: Secondary | ICD-10-CM | POA: Insufficient documentation

## 2012-11-25 DIAGNOSIS — R000 Tachycardia, unspecified: Secondary | ICD-10-CM

## 2012-11-25 DIAGNOSIS — I471 Supraventricular tachycardia, unspecified: Secondary | ICD-10-CM

## 2012-11-25 DIAGNOSIS — Z720 Tobacco use: Secondary | ICD-10-CM | POA: Insufficient documentation

## 2012-11-25 DIAGNOSIS — R079 Chest pain, unspecified: Secondary | ICD-10-CM

## 2012-11-25 DIAGNOSIS — F1410 Cocaine abuse, uncomplicated: Secondary | ICD-10-CM

## 2012-11-25 MED ORDER — NITROGLYCERIN 0.4 MG SL SUBL: 0.4000 mg | SUBLINGUAL_TABLET | SUBLINGUAL | Status: AC | PRN

## 2012-11-25 MED ORDER — LISINOPRIL 5 MG PO TABS: 5.0000 mg | ORAL_TABLET | Freq: Every day | ORAL | Status: AC

## 2012-11-25 MED ORDER — FUROSEMIDE 20 MG PO TABS: 20.0000 mg | ORAL_TABLET | Freq: Every day | ORAL | Status: AC

## 2012-11-25 MED ORDER — CARVEDILOL 3.125 MG PO TABS: 3.1250 mg | ORAL_TABLET | Freq: Two times a day (BID) | ORAL | Status: AC

## 2012-11-25 NOTE — Assessment & Plan Note (Addendum)
No further episodes of tachy-palpitations. NSR in the office today. Underlying cardiomyopathy and structural cardiac changes provide the predisposition for several arrhythmias. Discussed vagal maneuvers, additional BB use PRN and requesting EMS assistance with adenosine to break future episodes of SVT.

## 2012-11-25 NOTE — Assessment & Plan Note (Signed)
Smokes 1-2 cigarettes when anxious. She does take Wellbutrin. She has quit before and does not feel dependent on them. Advised to follow-up with her PCP for further stress management. Discussed several healthier options including exercise.

## 2012-11-25 NOTE — Progress Notes (Signed)
Date:  11/25/2012   ID:  Cathy Odonnell, DOB 1961-06-12, MRN [id number]  PCP:  Evelene Croon, MD  Primary Cardiologist:  Evaluated in consultation by Dr. Elease Hashimoto at [facility], also seen by Dr. Mariah Milling   History of Present Illness: Cathy Odonnell is a 52 y.o. female with PMHx s/f CAD (s/p PCI in 2009 by Dr. Kirke Corin), cocaine abuse, depression and fibromyalgia who was admitted to [facility] 5/5 to 11/17/12 for SVT in the setting of cocaine abuse who presents to day for post-hospital follow-up.   She presented to [facility] on 11/15/12 c/o chest pain and palpitations x 3 hours. She was found to be in SVT, rate  > 200. She was given adenosine 6 mg, then 12 mg IV with conversion to NSR. She was admitted and troponins returned mildly increased at <0.02->1.10->1.61->1.00. Negative D-dimer. Given her history of CAD, she underwent nuclear stress testing:  Lexiscan Myoview 11/17/12: fixed perfusion defect in the anterior, septal and apical region consistent with old scar. No significant ischemia noted in the inferior and lateral walls. Depressed EF, 28%.   She was discharged on ASA, Coreg, low-dose ACEi, Lasx. She did have evidence of UTI and was started on Levaquin x 3 days. She did have hypomangesemia at 1.6 which was repleted.   She has felt well since discharge. She denies chest pain, palpitations or lightheadedness. She does still note mild shortness of breath. She denies edema, PND or othopnea. She has vowed to quit recreational drugs. She continues to smoke 1-2 cigarettes occasionally when stressed. Adhering to all discharge medications without incident.   Labwork 5/5-11/17/2012  A1C 5.4%.  LDL 33 HDL 25 TG 144 TC 87  TSH 1.21  EKG: NSR, 82 bpm, downsloping ST depressions (< 1mm) with TWIs V5, V6, II, III, aVF, Q waves V1-V2  Wt Readings from Last 3 Encounters:  11/25/12 109 lb 12 oz (49.782 kg)     Past Medical History  Diagnosis Date  . Fibromyalgia   . Depression   . SVT (supraventricular  tachycardia)   . Urinary tract infection   . Cocaine abuse   . Hypomagnesemia   . MI (myocardial infarction)   . Cardiomyopathy     a. Lexiscan Myoview 11/17/12: EF 28%   . CAD (coronary artery disease)     a. s/p PCI in 2009 by Dr. Kirke Corin b. Lexiscan Myoview 11/17/12: EF 28%, fixed anterior, septal and apical defect without reversibility    Current Outpatient Prescriptions  Medication Sig Dispense Refill  . aspirin 81 MG tablet Take 81 mg by mouth daily.      . carvedilol (COREG) 3.125 MG tablet Take 3.125 mg by mouth 2 (two) times daily with a meal.      . ciprofloxacin (CIPRO) 500 MG tablet Take 500 mg by mouth every 12 (twelve) hours.      . clonazePAM (KLONOPIN) 0.5 MG tablet Take 0.5 mg by mouth 2 (two) times daily as needed for anxiety.      . cyclobenzaprine (FLEXERIL) 10 MG tablet Take 10 mg by mouth 3 (three) times daily as needed for muscle spasms.      . furosemide (LASIX) 20 MG tablet Take 10 mg by mouth daily.      Marland Kitchen lisinopril (PRINIVIL,ZESTRIL) 5 MG tablet Take 5 mg by mouth daily.      Marland Kitchen loratadine (CLARITIN) 10 MG tablet Take 10 mg by mouth daily.      . ranitidine (ZANTAC) 150 MG tablet Take 150 mg by mouth 3 (three)  times daily as needed for heartburn.       No current facility-administered medications for this visit.    Allergies:    Allergies  Allergen Reactions  . Vicodin (Hydrocodone-Acetaminophen)     Social History:  The patient  reports that she has been smoking.  She does not have any smokeless tobacco history on file. She reports that  drinks alcohol. She reports that she does not use illicit drugs.   ROS:  Please see the history of present illness.  All other systems reviewed and negative.   PHYSICAL EXAM: VS:  BP 102/62  Pulse 81  Ht 5\' 5"  (1.651 m)  Wt 109 lb 12 oz (49.782 kg)  BMI 18.26 kg/m2 Thin, appears older than stated age, in no acute distress HEENT: normal Neck: no JVD Cardiac:  normal S1, S2; RRR; no murmur Lungs:  clear to  auscultation bilaterally, no wheezing, rhonchi or rales Abd: soft, nontender, no hepatomegaly Ext: no edema Skin: warm and dry Neuro:  CNs 2-12 intact, no focal abnormalities noted

## 2012-11-25 NOTE — Assessment & Plan Note (Signed)
Stressed cessation of all recreational drugs. Discussed the amount of risk, and propensity of further cardiac decline, myocardiac injury and/or life-threatening arrhythmias with this. She has decided to stop.

## 2012-11-25 NOTE — Assessment & Plan Note (Addendum)
Suspect this is multifactorial secondary to prior ischemia/CAD and cocaine abuse. Stress test on recent admission revealed no evidence of inducible ischemia, but significant scar. She does note mild dyspnea (NYHA class II). Will increase Lasix to 20mg  daily. Discussed long-term CHF management including daily weights, sodium/fluid restriction and abstaining from illicit drugs and EtOH. BP remains labile precluding the increase of ACEi or BB- will continue at current doses. Plan for repeat echocardiogram in 3 months to re-evaluate EF and consider candidacy for ICD.

## 2012-11-25 NOTE — Patient Instructions (Addendum)
Please increase Lasix to 20mg  (one full tablet) daily.   Continue carvedilol and lisinopril. Monitor your blood pressure with these medications.   We will see you back in 3 months for a repeat echocardiogram and follow-up.  Monitor your weight daily. Monitor your breathing, swelling in your legs.   If you experience further heart racing, use Valsalva maneuvers, take an additional carvedilol and call 911.

## 2012-11-25 NOTE — Assessment & Plan Note (Signed)
No further chest pain. No new ischemic changes on EKG today. Stress test at [facility] this month indicated no evidence of reversible ischemia, but significant multifocal scar. EF was ~ 28% by gated imaging. Will continue low-dose ASA, ACEi, BB. Will provide with a refill of nitroglycerin.

## 2012-12-14 ENCOUNTER — Telehealth: Payer: Self-pay | Admitting: *Deleted

## 2012-12-14 NOTE — Telephone Encounter (Signed)
Patient has questions about prescriptions that were sent in last ov, per pt alamap doesn't prescribe to escribe and needs to be sent electronically.

## 2012-12-15 NOTE — Telephone Encounter (Signed)
LMOM TCB regarding medications to be sent to [facility]

## 2012-12-17 DIAGNOSIS — R000 Tachycardia, unspecified: Secondary | ICD-10-CM

## 2012-12-17 DIAGNOSIS — F17200 Nicotine dependence, unspecified, uncomplicated: Secondary | ICD-10-CM

## 2012-12-17 DIAGNOSIS — F1410 Cocaine abuse, uncomplicated: Secondary | ICD-10-CM

## 2012-12-17 DIAGNOSIS — I428 Other cardiomyopathies: Secondary | ICD-10-CM

## 2012-12-17 NOTE — Telephone Encounter (Signed)
Spoke with Lovell Sink at [facility] need to fax Rx to [phone]. I will have Dr. Mariah Milling sign Rx's and fax to [facility].

## 2012-12-17 NOTE — Telephone Encounter (Signed)
LMOM to have patient call back regarding ALAMAP

## 2013-03-01 ENCOUNTER — Ambulatory Visit: Payer: Self-pay | Admitting: Cardiology

## 2013-03-01 DIAGNOSIS — R0989 Other specified symptoms and signs involving the circulatory and respiratory systems: Secondary | ICD-10-CM

## 2013-03-07 ENCOUNTER — Other Ambulatory Visit: Payer: Self-pay

## 2013-03-07 ENCOUNTER — Ambulatory Visit: Payer: Self-pay | Admitting: Cardiovascular Disease

## 2013-06-24 ENCOUNTER — Other Ambulatory Visit: Payer: Self-pay

## 2013-06-24 IMAGING — CR DG CHEST 1V PORT
1 series · 2 of 2 positions shown · non-contrast
Comparison: 11/15/2012

CLINICAL DATA: Shortness of breath and chest pain.

EXAM:
PORTABLE CHEST - 1 VIEW

[Series 1: ap · 0.17mm/px · 2 of 2 slices shown]
[im 1/2]
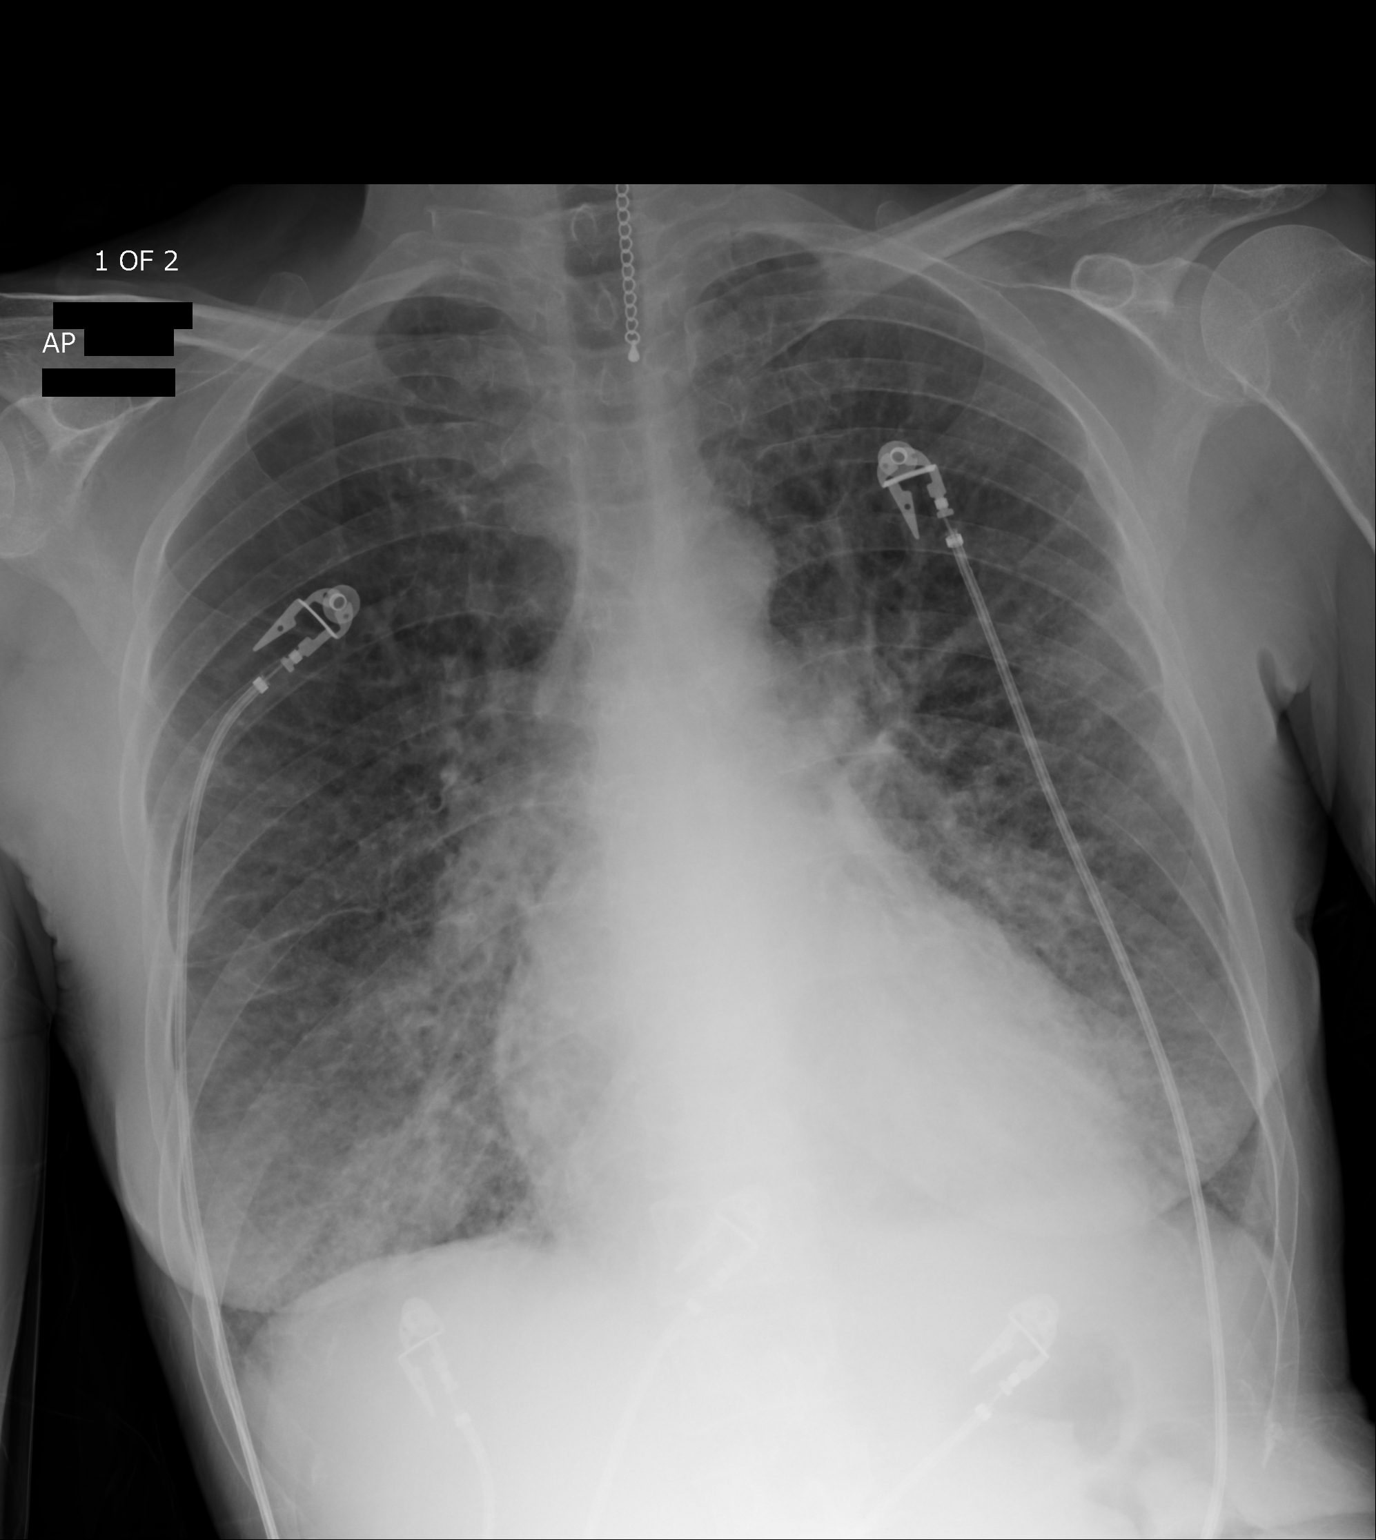
[im 2/2]
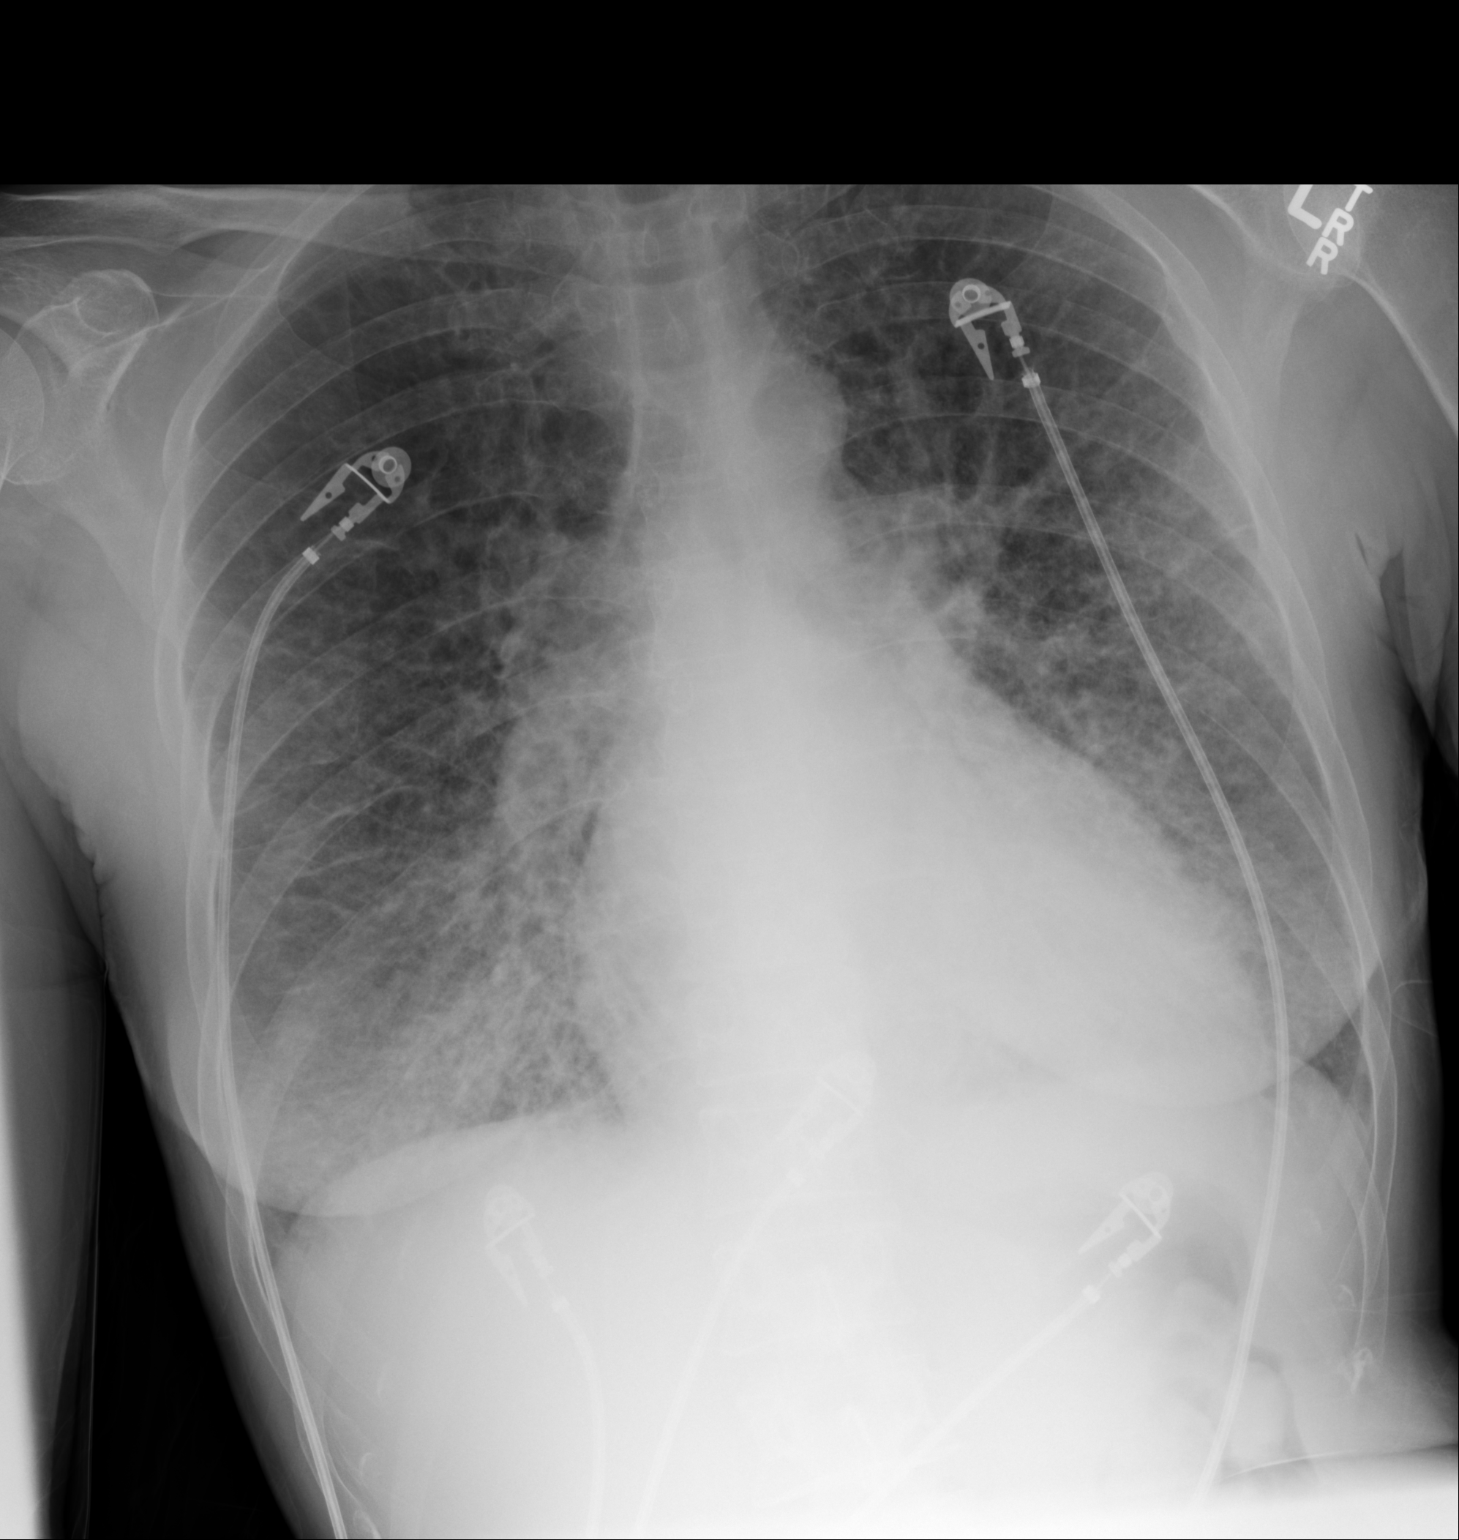

[2 of 2 positions shown; findings below may reference images not displayed]

FINDINGS: Cardiomegaly persists. Interstitial prominence has worsened from
priors, favor congestive heart failure. No significant effusion. No
pneumothorax. No lobar consolidation.
IMPRESSION: Cardiomegaly with worsening interstitial prominence, likely CHF.

## 2013-06-25 DIAGNOSIS — I059 Rheumatic mitral valve disease, unspecified: Secondary | ICD-10-CM

## 2013-06-25 DIAGNOSIS — I5023 Acute on chronic systolic (congestive) heart failure: Secondary | ICD-10-CM

## 2013-07-19 ENCOUNTER — Other Ambulatory Visit: Payer: Self-pay | Admitting: Emergency Medicine

## 2013-07-19 IMAGING — CR DG CHEST 1V PORT
1 series · 1 of 1 positions shown · non-contrast
Comparison: June 24, 2013

CLINICAL DATA: Chest pain

EXAM:
PORTABLE CHEST - 1 VIEW

[ap]
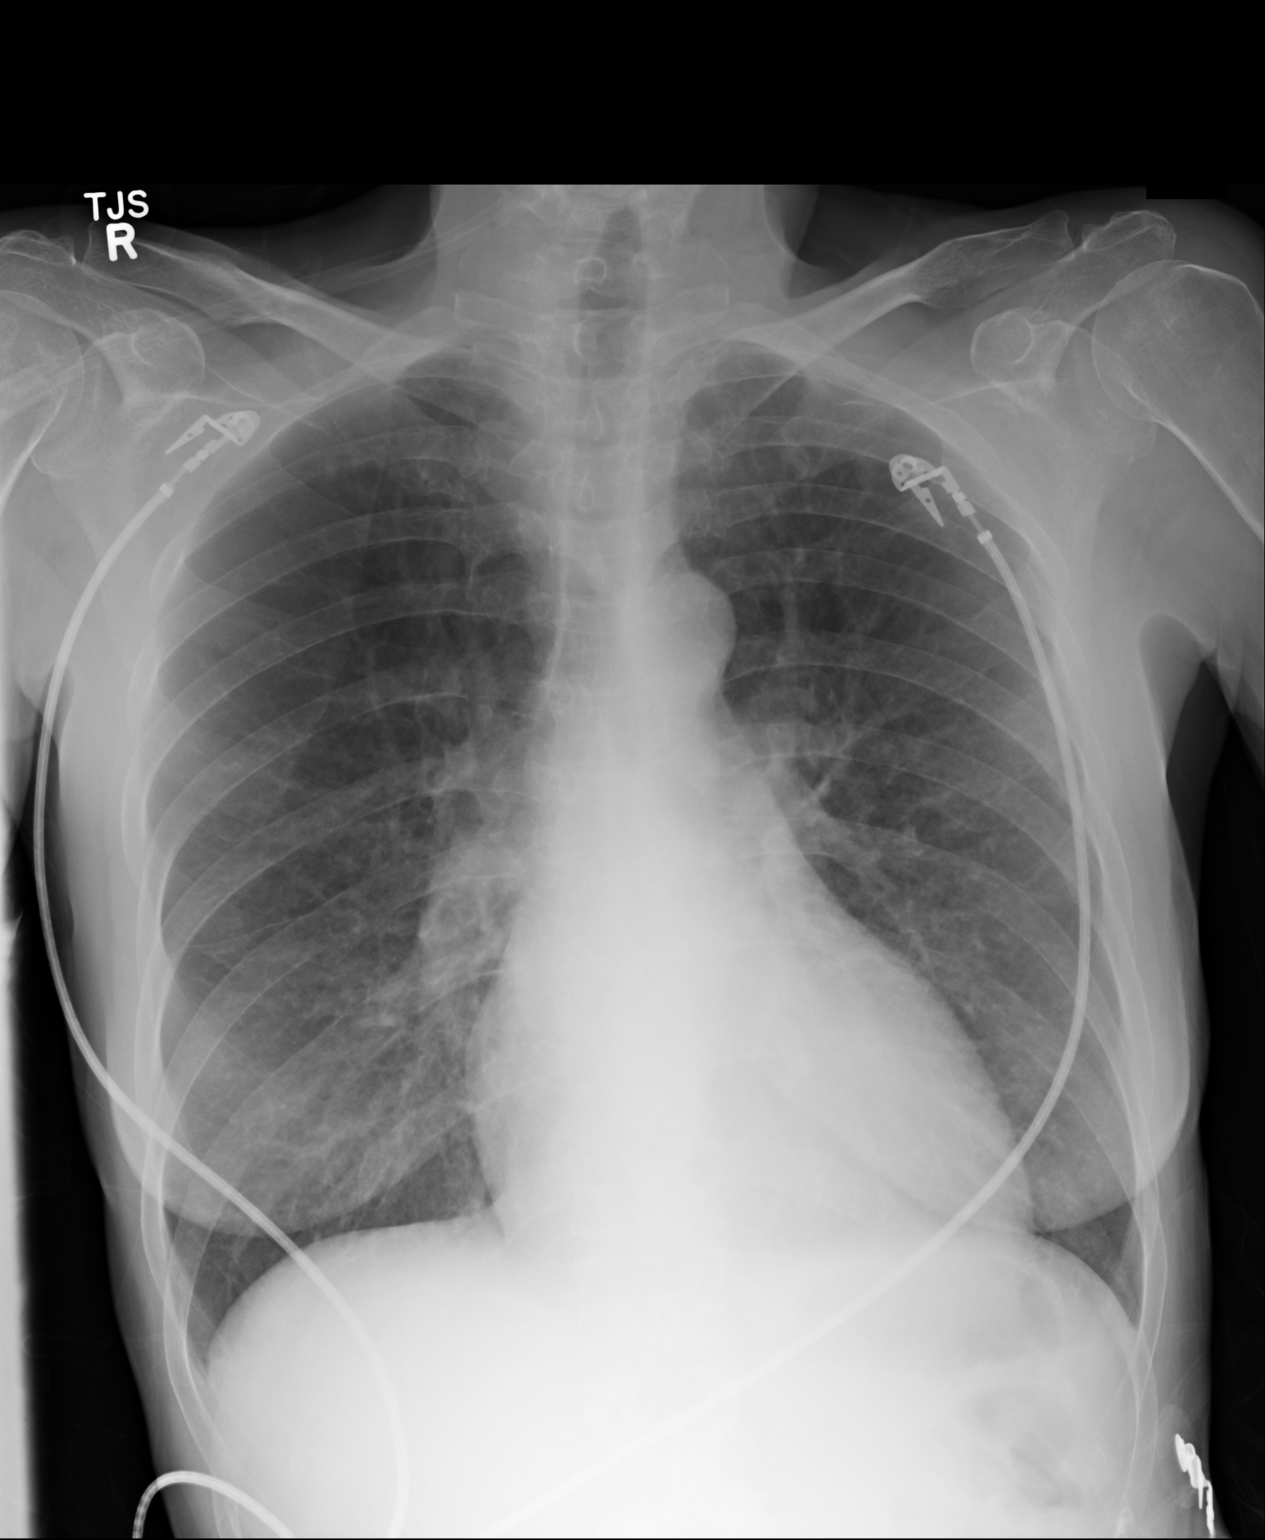

[1 of 1 positions shown; findings below may reference images not displayed]

FINDINGS: There is underlying emphysema. There is no edema or consolidation.
Heart is enlarged with normal pulmonary vascularity. No adenopathy.
No bone lesions.
IMPRESSION: Underlying emphysema.  Cardiomegaly.  No edema or consolidation.

## 2013-07-20 DIAGNOSIS — R079 Chest pain, unspecified: Secondary | ICD-10-CM

## 2013-07-21 DIAGNOSIS — R7989 Other specified abnormal findings of blood chemistry: Secondary | ICD-10-CM

## 2013-07-22 ENCOUNTER — Telehealth: Payer: Self-pay | Admitting: *Deleted

## 2013-07-22 NOTE — Telephone Encounter (Signed)
Attempted to contact pt regarding discharge from [facility] on 07/21/13. Left detailed message asking pt to call w/ any questions about medications and/or discharge instructions. Pt has appt w/ Dr. Kirke CorinArida 07/29/13 @ 2:30.  Asked pt to call if this is not a convenient time.

## 2013-07-29 ENCOUNTER — Ambulatory Visit: Payer: Self-pay | Admitting: Cardiovascular Disease

## 2013-08-02 ENCOUNTER — Other Ambulatory Visit: Payer: Self-pay

## 2013-08-02 ENCOUNTER — Other Ambulatory Visit: Payer: Self-pay | Admitting: Emergency Medicine

## 2013-08-02 IMAGING — CR DG CHEST 1V PORT
1 series · 1 of 1 positions shown · non-contrast
Comparison: Prior chest x-ray 07/19/2013

CLINICAL DATA: Unresponsive, intubated

EXAM:
PORTABLE CHEST - 1 VIEW

[ap]
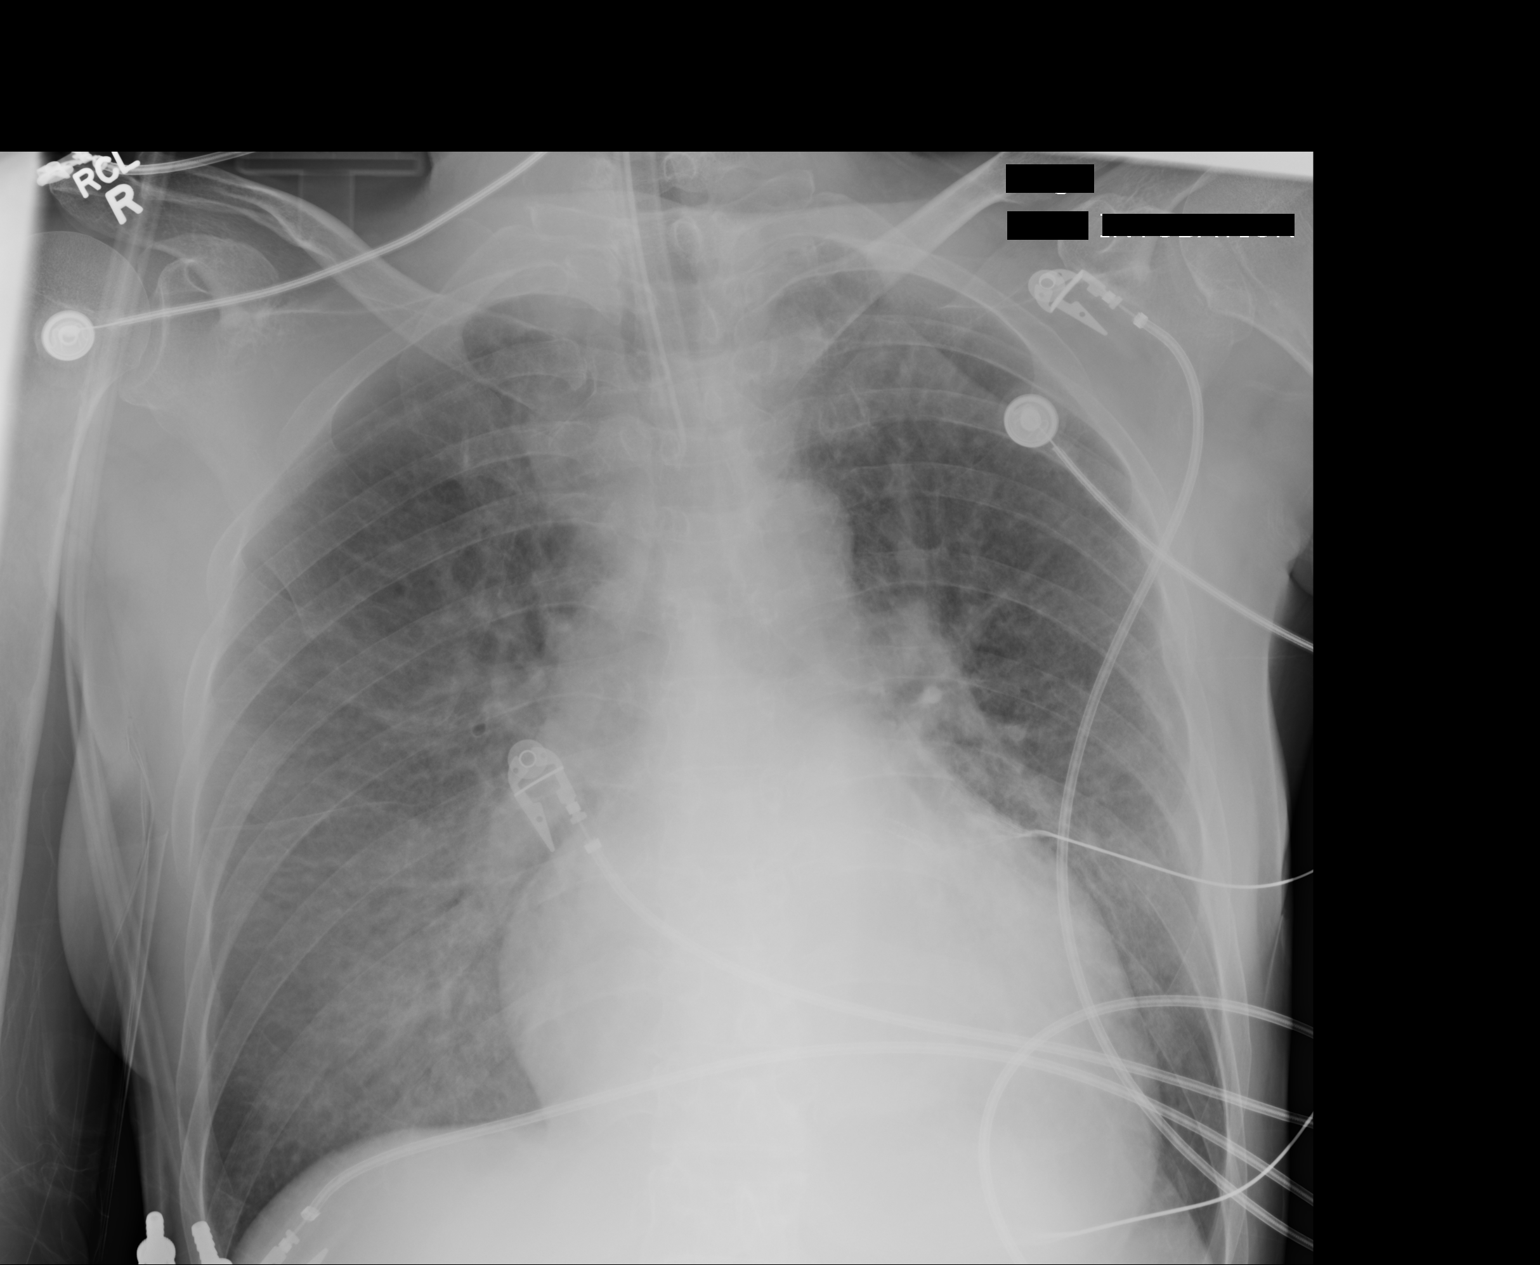

[1 of 1 positions shown; findings below may reference images not displayed]

FINDINGS: The tip of the endotracheal tube is in good position 4 cm above the
carinal. Stable marked enlargement of the cardiopericardial
silhouette lower inspiratory volumes with increased pulmonary
vascular congestion and now mild interstitial and alveolar edema. No
pneumothorax or large pleural effusion. No acute osseous
abnormality.
IMPRESSION: Lower inspiratory volumes with mild acute pulmonary edema.

Endotracheal tube is in good position 4 cm above the carina.

## 2013-08-02 IMAGING — CR DG CHEST 1V PORT
1 series · 1 of 1 positions shown · non-contrast
Comparison: Prior radiograph from earlier the same day

CLINICAL DATA: Central line placement

EXAM:
PORTABLE CHEST - 1 VIEW

[ap]
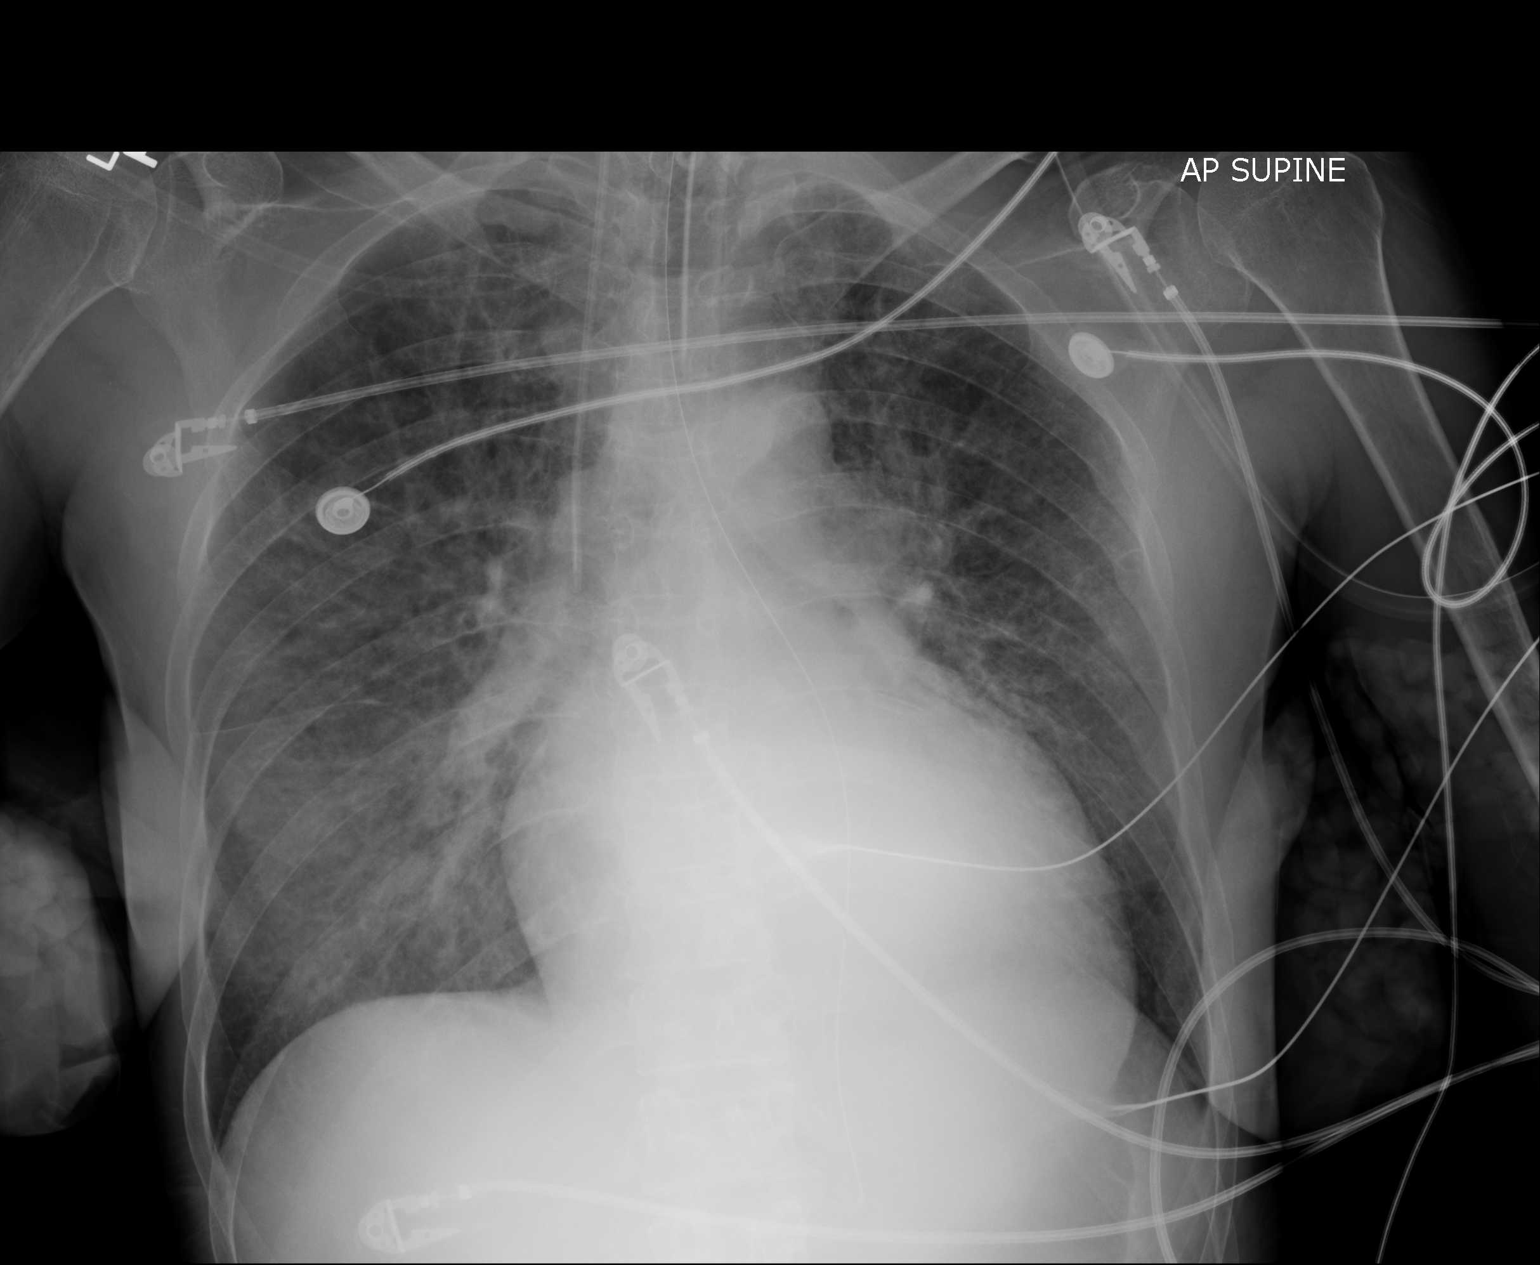

[1 of 1 positions shown; findings below may reference images not displayed]

FINDINGS: Tip of the endotracheal tube is positioned 3.5 cm above the carina.
There has been interval placement of a right IJ central venous
catheter with tip overlying the mid SVC. Enteric tube courses into
the abdomen. Cardiomegaly is stable.

Lungs are normally inflated. Mild interstitial and alveolar edema is
not significantly changed. No pleural effusion.

No acute osseous abnormality. Remote left-sided rib fractures noted.
IMPRESSION: 1. Interval placement of right IJ central venous catheter with tip
overlying the mid SVC. No pneumothorax.
2. Tip of endotracheal tube above the carina.
3. Cardiomegaly with mild diffuse pulmonary edema, unchanged.

## 2013-08-02 IMAGING — CT CT HEAD WITHOUT CONTRAST
2 series · 14 of 30 positions shown, 16 images · non-contrast
Comparison: 09/28/2009

CLINICAL DATA: Found unresponsive.

EXAM:
CT HEAD WITHOUT CONTRAST
TECHNIQUE: Contiguous axial images were obtained from the base of the skull
through the vertex without intravenous contrast.

[Series 2: head wo · axial · 0.39mm/px · z∈[-110,-6]mm · 6 of 31 slices shown, 8 images]
[im 5/31  brain]
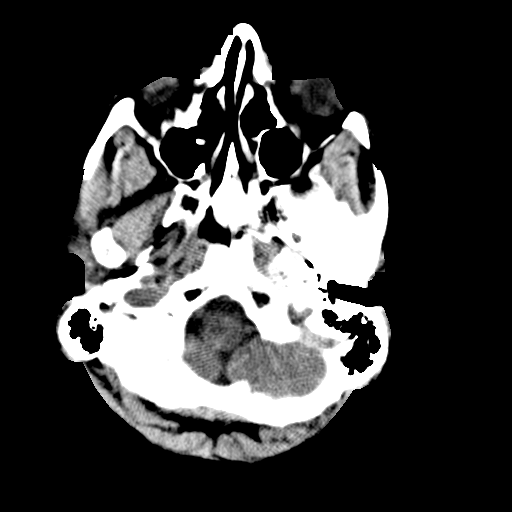
[im 5/31  bone]
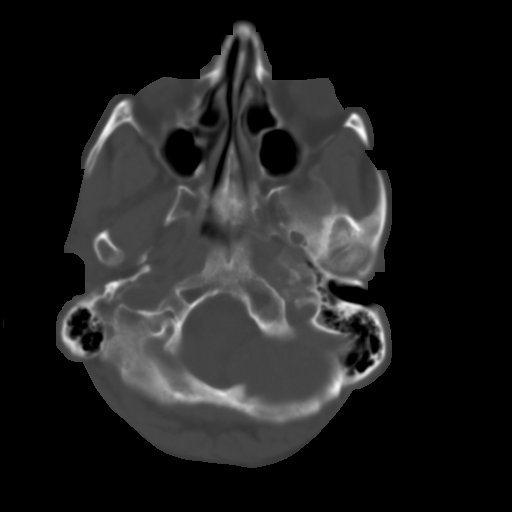
[im 9/31  brain]
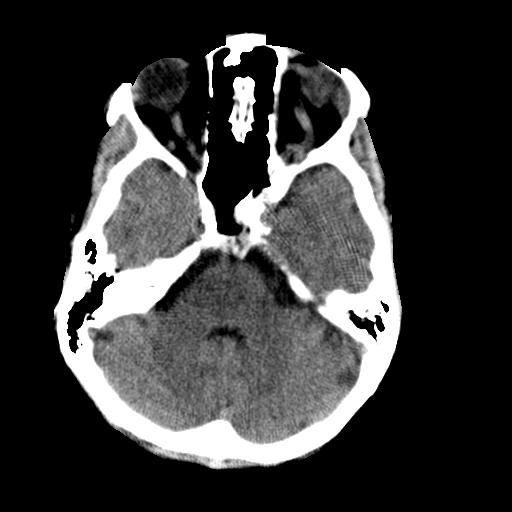
[im 13/31  brain]
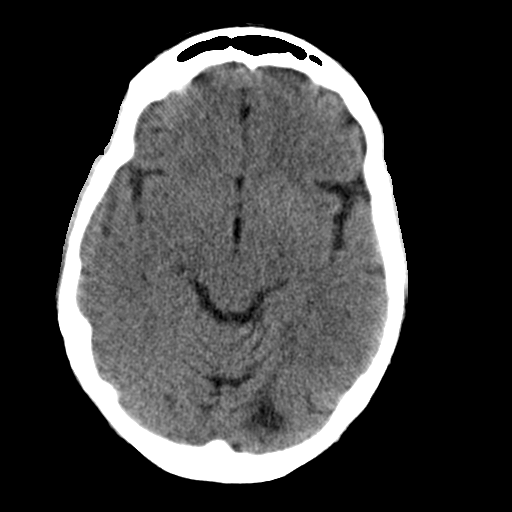
[im 18/31  brain]
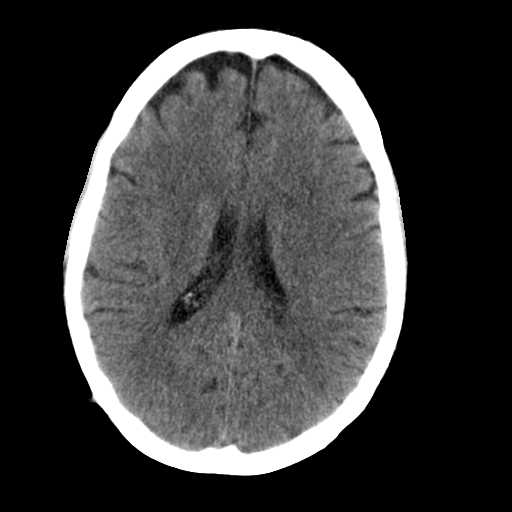
[im 22/31  brain]
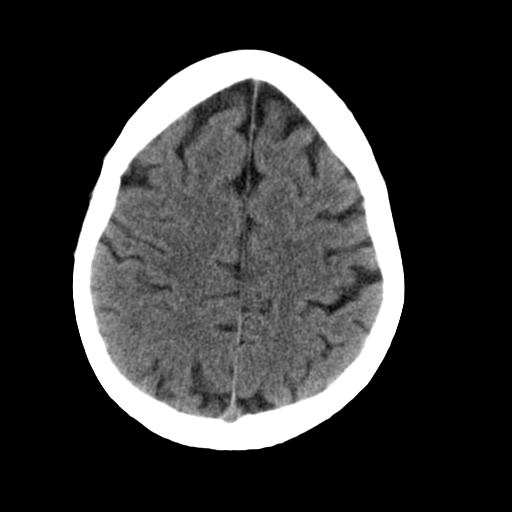
[im 22/31  bone]
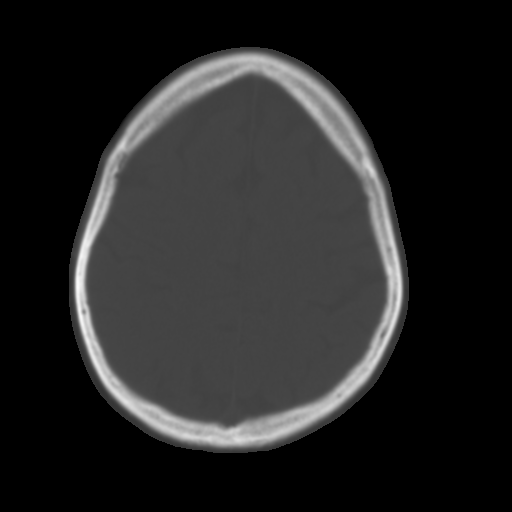
[im 26/31  brain]
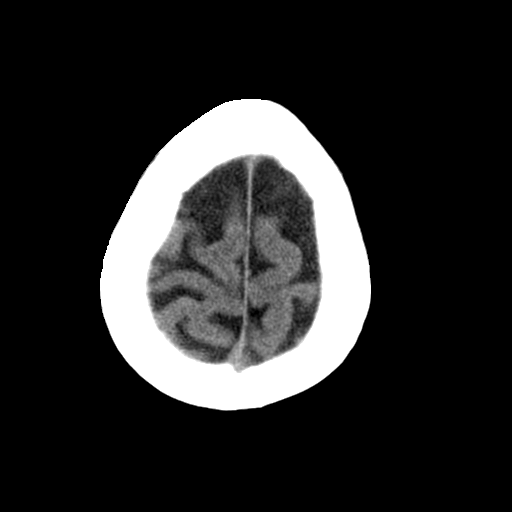

[Series 3: head bone · axial · 0.39mm/px · z∈[-123,+13]mm · 8 of 84 slices shown]
[im 8/84  bone]
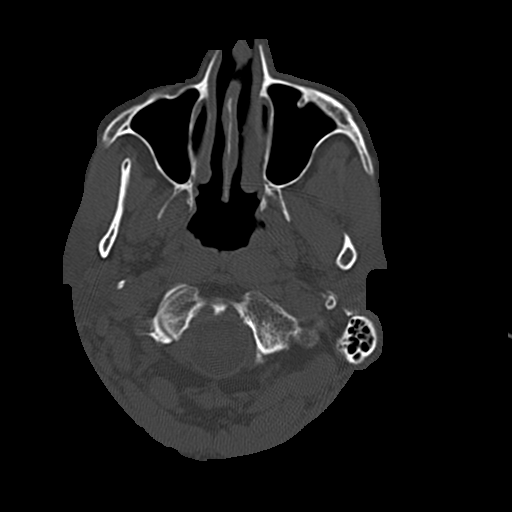
[im 16/84  bone]
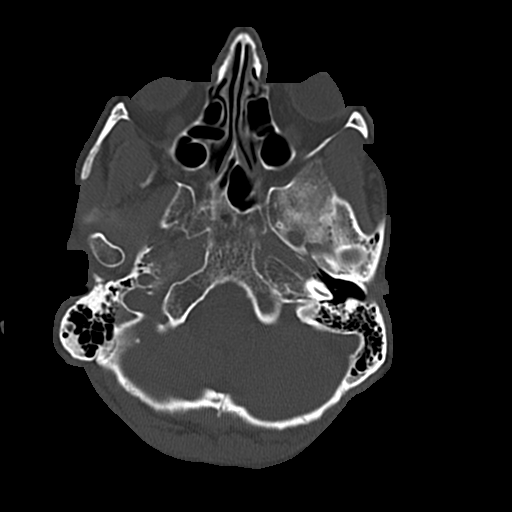
[im 28/84  bone]
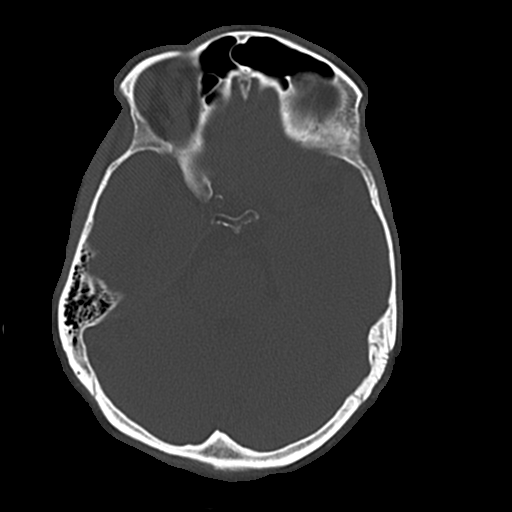
[im 36/84  bone]
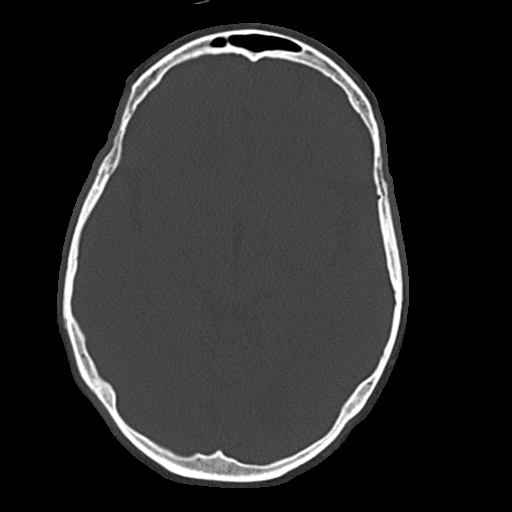
[im 48/84  bone]
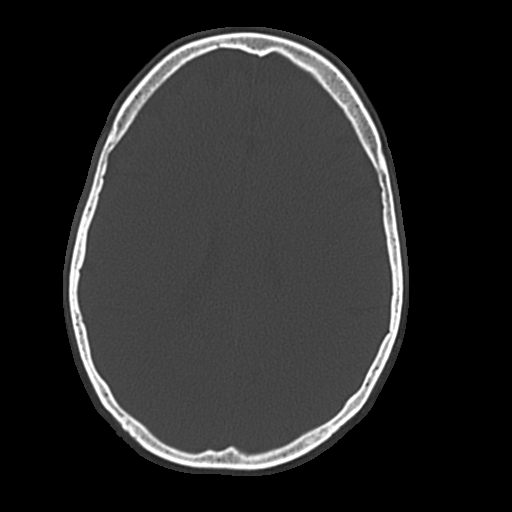
[im 56/84  bone]
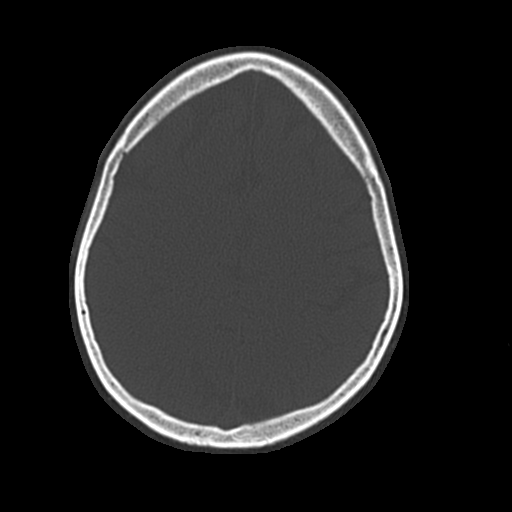
[im 68/84  bone]
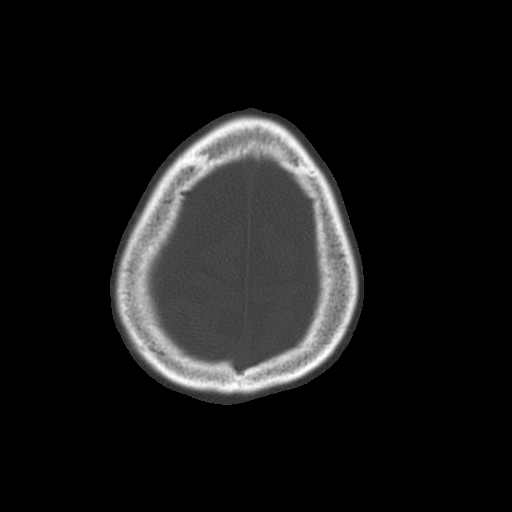
[im 76/84  bone]
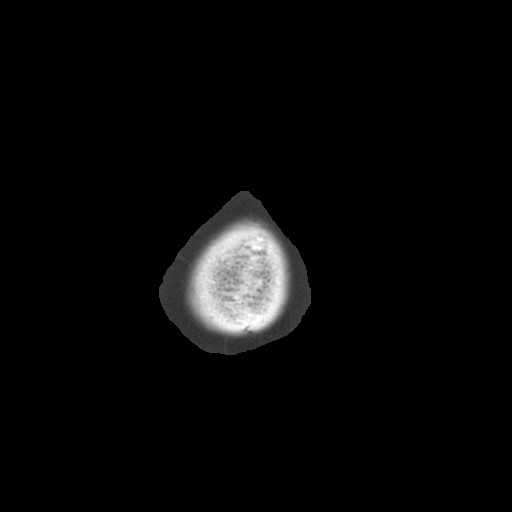

[14 of 30 positions shown; findings below may reference images not displayed]

FINDINGS: Ventricles, cisterns and other CSF spaces are within normal. There
is evidence of mild chronic ischemic microvascular disease. There is
no mass, mass effect, shift of midline structures or acute
hemorrhage. There is no evidence to suggest acute infarction. There
findings suggesting old small left occipital infarct. Remaining
bones and soft tissues are unremarkable.
IMPRESSION: No acute intracranial findings.

Chronic ischemic microvascular disease. Old small left occipital
infarct.

## 2013-08-03 ENCOUNTER — Other Ambulatory Visit: Payer: Self-pay | Admitting: Internal Medicine

## 2013-08-03 ENCOUNTER — Other Ambulatory Visit: Payer: Self-pay

## 2013-08-03 DIAGNOSIS — I498 Other specified cardiac arrhythmias: Secondary | ICD-10-CM

## 2013-08-03 DIAGNOSIS — I469 Cardiac arrest, cause unspecified: Secondary | ICD-10-CM

## 2013-08-03 DIAGNOSIS — I429 Cardiomyopathy, unspecified: Secondary | ICD-10-CM

## 2013-08-03 IMAGING — CR DG CHEST 1V PORT
1 series · 1 of 1 positions shown · non-contrast
Comparison: Chest radiograph August 10, 2013

CLINICAL DATA: Ventilation.

EXAM:
PORTABLE CHEST - 1 VIEW

[ap]
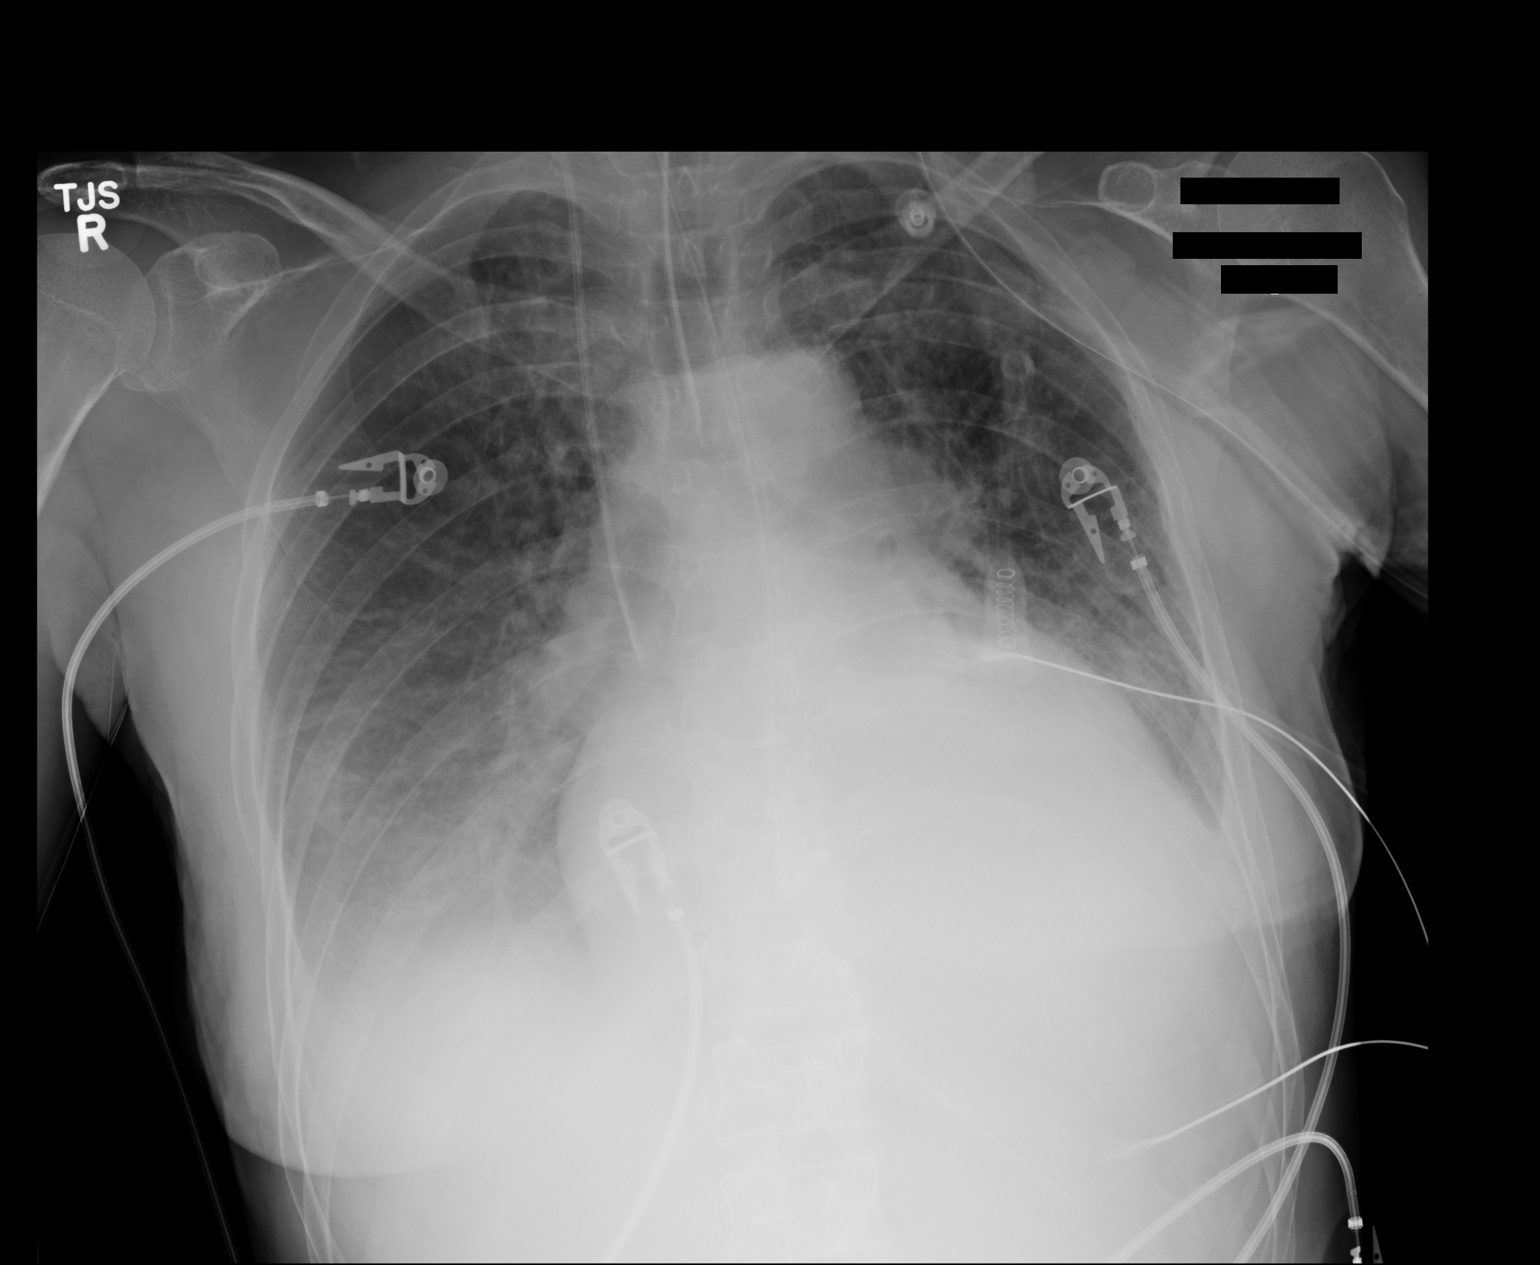

[1 of 1 positions shown; findings below may reference images not displayed]

FINDINGS: Cardiac silhouette remains enlarged, mediastinal silhouette is
nonsuspicious. Increasing central pulmonary vasculature congestion,
interstitial prominence with left greater than right lung base
airspace opacities and small to moderate layering pleural effusions.
No pneumothorax.

Endotracheal tube tip projects 2.9 cm above the carina. Right
internal jugular central venous catheter with distal tip projecting
in distal superior vena cava. Nasogastric tube tip projects in
proximal stomach. Soft tissue planes and included osseous structures
are nonsuspicious.
IMPRESSION: Stable cardiomegaly, with apparent worsening pulmonary edema, with
increasing left greater than right lung base airspace opacities in
the small to moderate layering pleural effusions.

No apparent change in life-support lines.

  By: Aku-Ville Raak

## 2013-08-04 ENCOUNTER — Other Ambulatory Visit: Payer: Self-pay | Admitting: Internal Medicine

## 2013-08-04 ENCOUNTER — Other Ambulatory Visit: Payer: Self-pay

## 2013-08-04 IMAGING — CR DG CHEST 1V PORT
1 series · 1 of 1 positions shown · non-contrast
Comparison: Prior chest x-ray 08/03/2013

CLINICAL DATA: Ventilated patient, hypothermic protocol

EXAM:
PORTABLE CHEST - 1 VIEW

[ap]
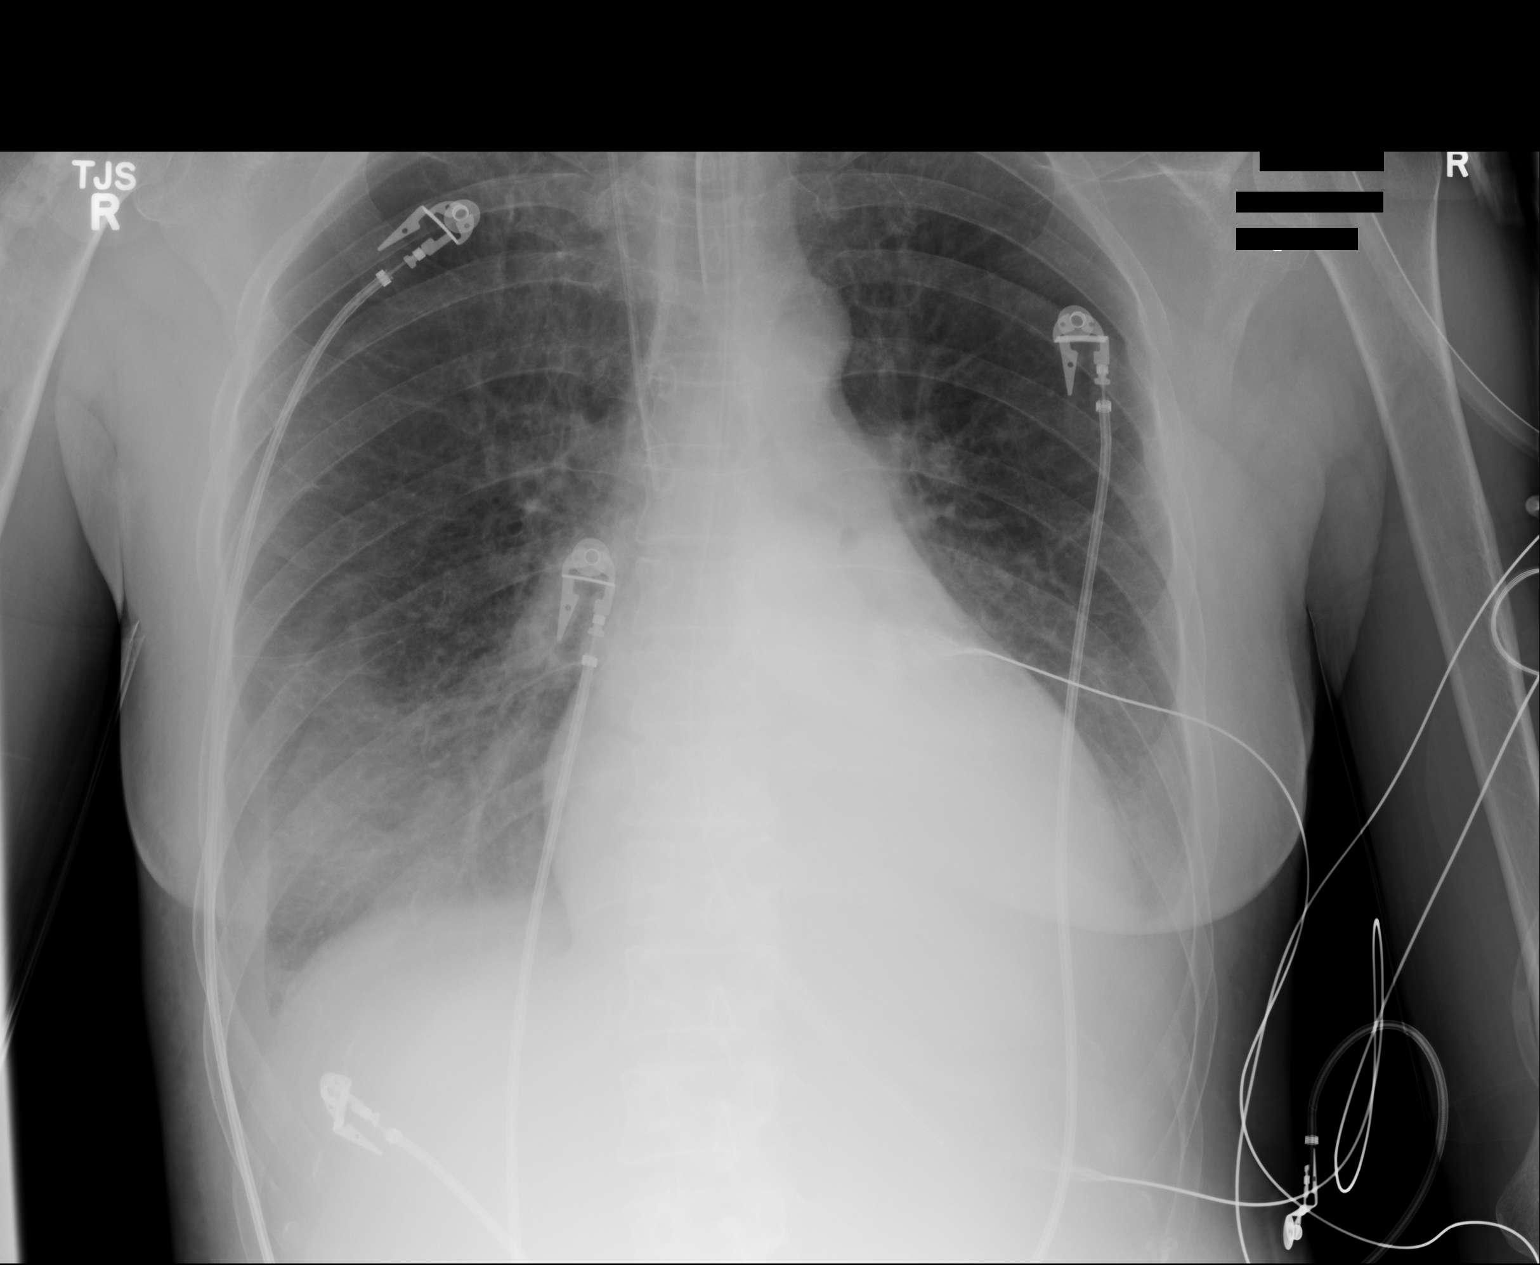

[1 of 1 positions shown; findings below may reference images not displayed]

FINDINGS: The endotracheal tube is 3 cm above the carina. Right IJ approach
central venous catheter in good position with the tip in the mid
SVC. Nasogastric tube tip projects over the gastric bubble. Improved
inspiratory volumes. Persistent bilateral layering pleural effusions
with associated bibasilar atelectasis. Background changes of COPD
and emphysema. No pneumothorax. No acute osseous abnormality. Stable
cardiomegaly.
IMPRESSION: 1. Improved inspiratory volumes with slightly decreased interstitial
edema.
2. Persistent right slightly larger than left bilateral layering
pleural effusions and associated bibasilar atelectasis.
3. Support apparatus remain in stable and satisfactory position.

## 2013-08-05 IMAGING — CT CT HEAD WITHOUT CONTRAST
2 series · 16 of 30 positions shown, 20 images · non-contrast
Comparison: 08/02/2013.

CLINICAL DATA: Prolonged CPR.  Evaluate for anoxic injury.

EXAM:
CT HEAD WITHOUT CONTRAST
TECHNIQUE: Contiguous axial images were obtained from the base of the skull
through the vertex without intravenous contrast.

[Series 2: head wo · axial · 0.43mm/px · z∈[+318,+448]mm · 13 of 32 slices shown, 17 images]
[im 3/32  brain]
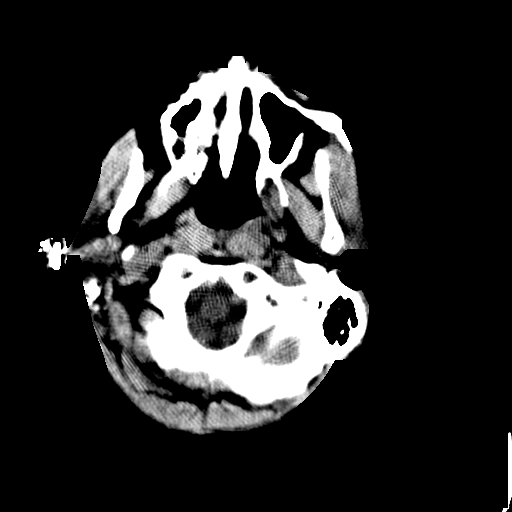
[im 3/32  bone]
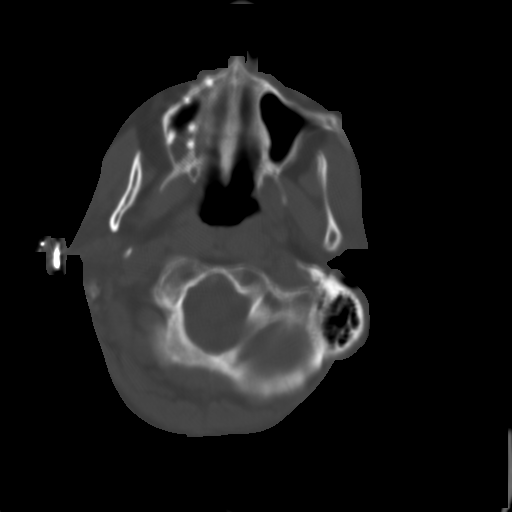
[im 5/32  brain]
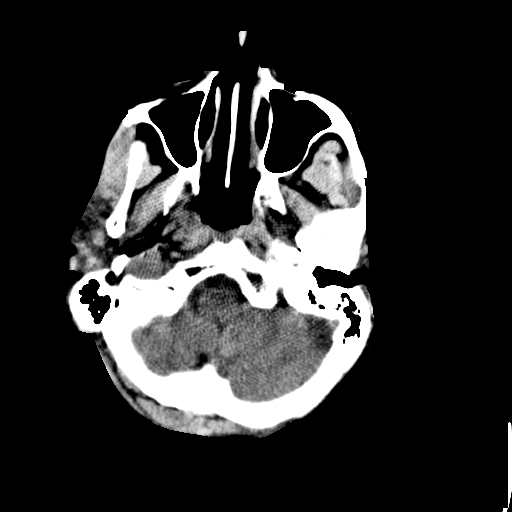
[im 7/32  brain]
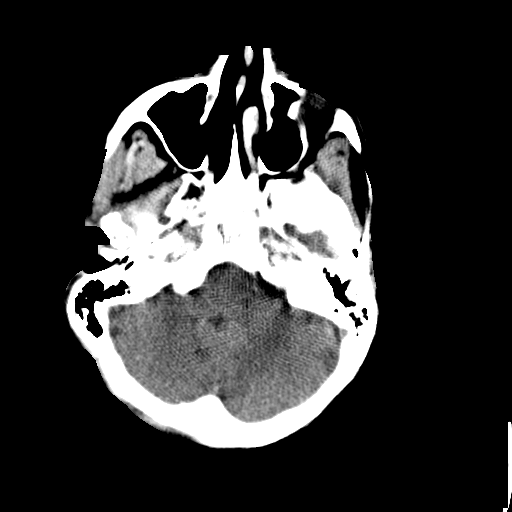
[im 9/32  brain]
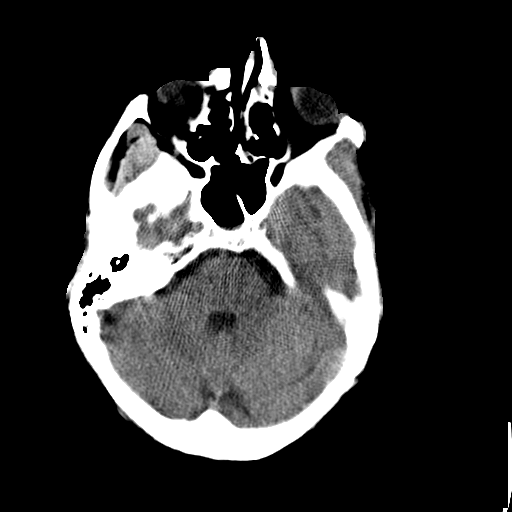
[im 12/32  brain]
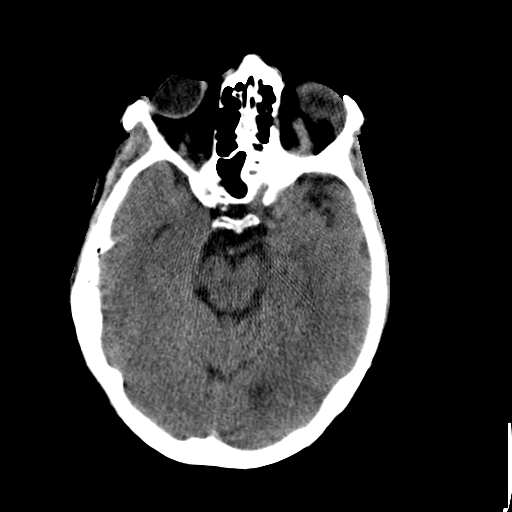
[im 12/32  bone]
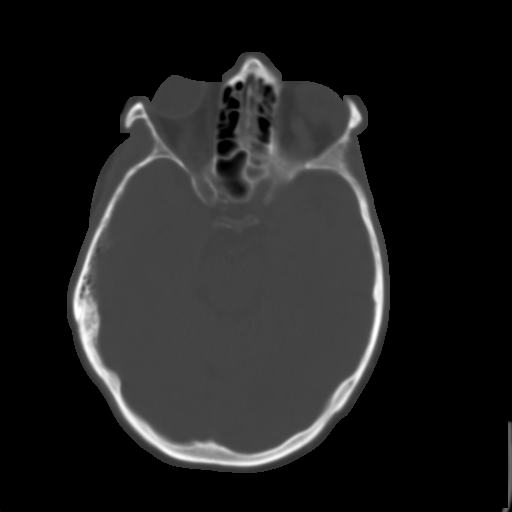
[im 14/32  brain]
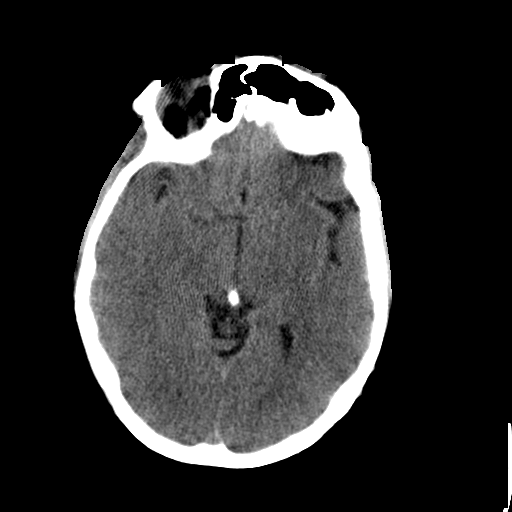
[im 16/32  brain]
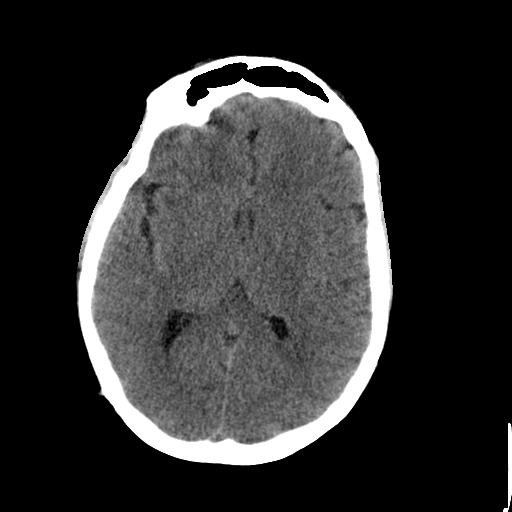
[im 18/32  brain]
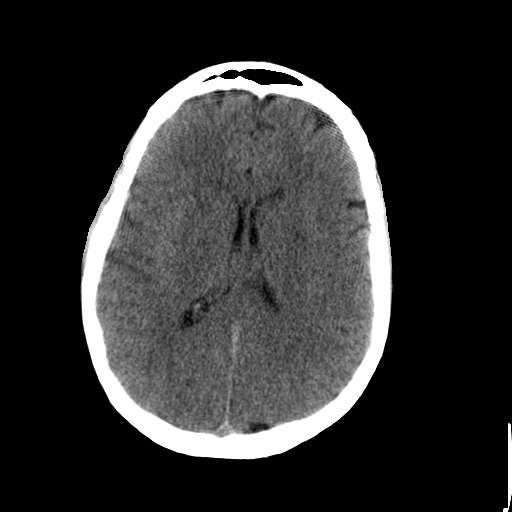
[im 20/32  brain]
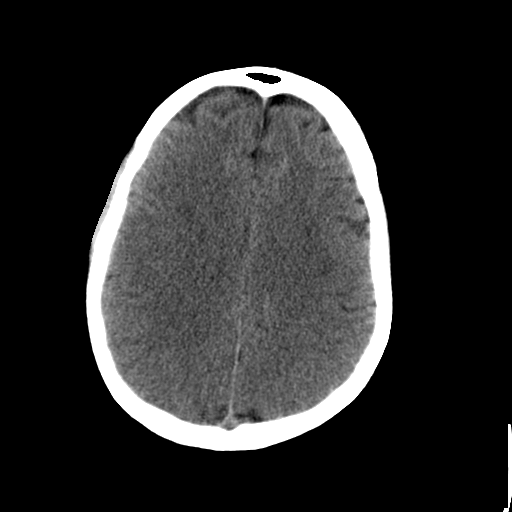
[im 20/32  bone]
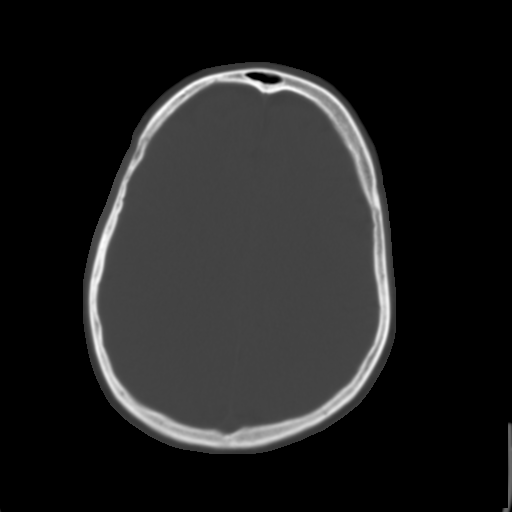
[im 23/32  brain]
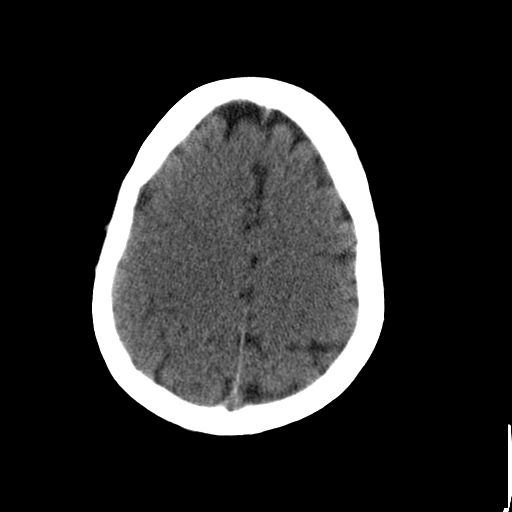
[im 25/32  brain]
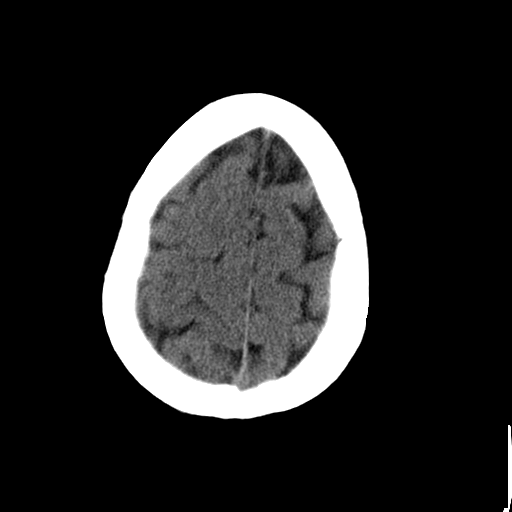
[im 27/32  brain]
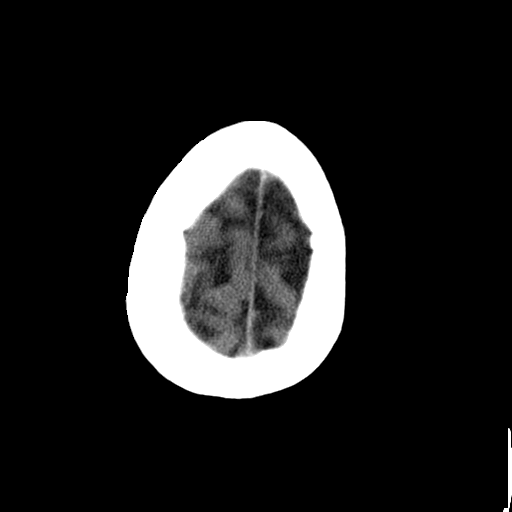
[im 29/32  brain]
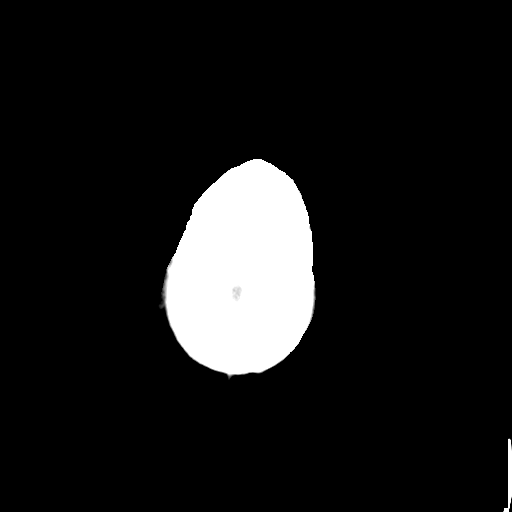
[im 29/32  bone]
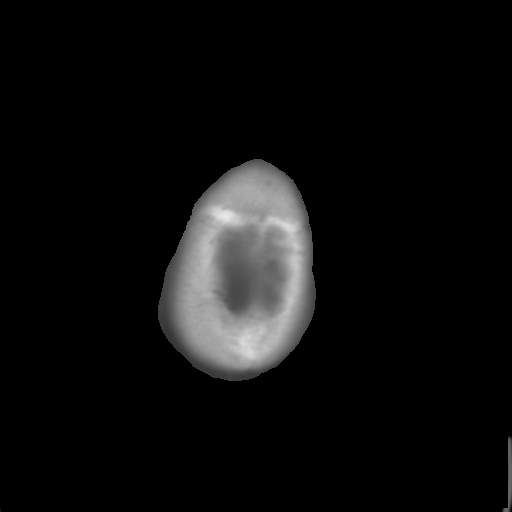

[Series 4: head wo recon · axial · 0.39mm/px · z∈[+360,+403]mm · 3 of 32 slices shown]
[im 3/32  brain]
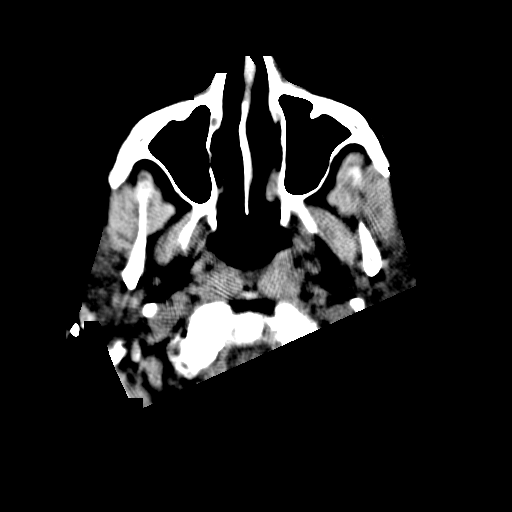
[im 7/32  brain]
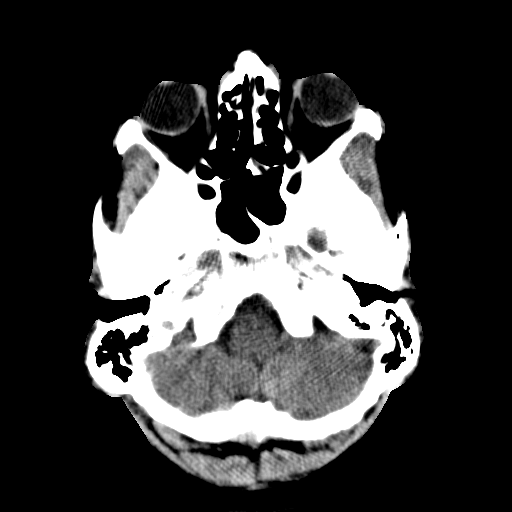
[im 12/32  brain]
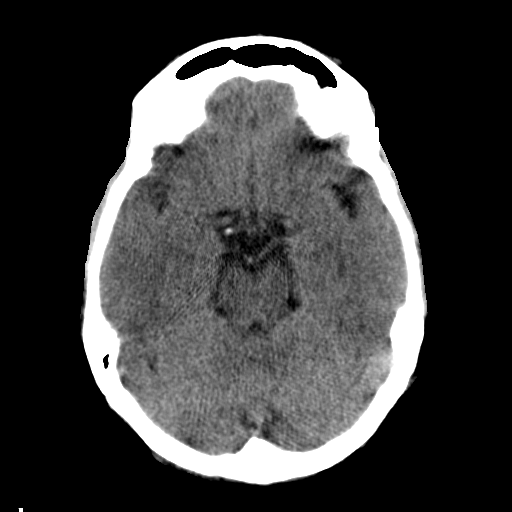

[16 of 30 positions shown; findings below may reference images not displayed]

FINDINGS: No mass. No hemorrhage. No hydrocephalus. The lateral ventricles are
noted to be narrowed compared to prior exam. Sulci are less
prominent. These findings suggest diffuse cerebral edema. Orbits are
intact. Paranasal sinuses are clear. Mastoids are clear.
IMPRESSION: Developing diffuse bilateral cerebral edema. This would be
consistent with global cerebral anoxia. These results will be called
to the ordering clinician or representative by the Radiologist
Assistant, and communication documented in the PACS Dashboard.

## 2013-08-14 DEATH — deceased
